# Patient Record
Sex: Female | Born: 1980 | Race: White | Hispanic: No | Marital: Married | Smoking: Current every day smoker
Health system: Southern US, Community
[De-identification: ages and names within clinical notes are randomized; demographics above are authoritative.]

## PROBLEM LIST (undated history)

## (undated) DIAGNOSIS — K219 Gastro-esophageal reflux disease without esophagitis: Secondary | ICD-10-CM

## (undated) DIAGNOSIS — F419 Anxiety disorder, unspecified: Secondary | ICD-10-CM

## (undated) DIAGNOSIS — M419 Scoliosis, unspecified: Secondary | ICD-10-CM

## (undated) DIAGNOSIS — Z8619 Personal history of other infectious and parasitic diseases: Secondary | ICD-10-CM

## (undated) DIAGNOSIS — Z90710 Acquired absence of both cervix and uterus: Secondary | ICD-10-CM

## (undated) HISTORY — DX: Scoliosis, unspecified: M41.9

## (undated) HISTORY — PX: UPPER GI ENDOSCOPY: SHX6162

## (undated) HISTORY — PX: COLONOSCOPY: SHX174

---

## 2004-04-23 ENCOUNTER — Inpatient Hospital Stay: Payer: Self-pay | Admitting: Unknown Physician Specialty

## 2004-04-28 ENCOUNTER — Inpatient Hospital Stay: Payer: Self-pay

## 2007-06-20 ENCOUNTER — Emergency Department: Payer: Self-pay | Admitting: Emergency Medicine
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
## 2009-04-18 HISTORY — PX: TUBAL LIGATION: SHX77

## 2009-12-31 ENCOUNTER — Observation Stay: Payer: Self-pay | Admitting: Obstetrics and Gynecology

## 2010-01-04 ENCOUNTER — Observation Stay: Payer: Self-pay | Admitting: Obstetrics and Gynecology

## 2010-01-22 ENCOUNTER — Inpatient Hospital Stay: Payer: Self-pay | Admitting: Obstetrics and Gynecology

## 2010-01-27 LAB — PATHOLOGY REPORT

## 2014-04-25 ENCOUNTER — Emergency Department: Payer: Self-pay | Admitting: Emergency Medicine

## 2015-02-23 ENCOUNTER — Other Ambulatory Visit: Payer: Self-pay | Admitting: Family Medicine

## 2015-03-25 ENCOUNTER — Other Ambulatory Visit: Payer: Self-pay | Admitting: Family Medicine

## 2015-05-29 ENCOUNTER — Ambulatory Visit (INDEPENDENT_AMBULATORY_CARE_PROVIDER_SITE_OTHER): Payer: Self-pay | Admitting: Family Medicine

## 2015-05-29 VITALS — BP 118/68 | HR 92 | Temp 98.1°F | Resp 20 | Ht 66.0 in | Wt 156.1 lb

## 2015-05-29 DIAGNOSIS — N3001 Acute cystitis with hematuria: Secondary | ICD-10-CM

## 2015-05-29 LAB — POCT URINALYSIS DIPSTICK
Bilirubin, UA: NEGATIVE
GLUCOSE UA: NEGATIVE
Ketones, UA: NEGATIVE
NITRITE UA: NEGATIVE
Protein, UA: NEGATIVE
SPEC GRAV UA: 1.02
Urobilinogen, UA: NEGATIVE
pH, UA: 6.5

## 2015-05-29 MED ORDER — CEPHALEXIN 500 MG PO CAPS: 500.0000 mg | ORAL_CAPSULE | Freq: Two times a day (BID) | ORAL | Status: DC

## 2015-05-29 NOTE — Progress Notes (Signed)
Name: Susan Wade   MRN: [id number]    DOB: 23-Dec-1980   Date:05/29/2015       Progress Note  Subjective  Chief Complaint  Chief Complaint  Patient presents with  . Urinary Tract Infection    Urinary Tract Infection  This is a new problem. The pain is at a severity of 7/10. There has been no fever. Associated symptoms include hematuria. Her past medical history is significant for recurrent UTIs.     No past medical history on file.  No past surgical history on file.  No family history on file.  Social History   Social History  . Marital Status: Married    Spouse Name: N/A  . Number of Children: N/A  . Years of Education: N/A   Occupational History  . Not on file.   Social History Main Topics  . Smoking status: Not on file  . Smokeless tobacco: Not on file  . Alcohol Use: Not on file  . Drug Use: Not on file  . Sexual Activity: Not on file   Other Topics Concern  . Not on file   Social History Narrative  . No narrative on file     Current outpatient prescriptions:  Marland Kitchen  MONO-LINYAH 0.25-35 MG-MCG tablet, take 1 tablet by mouth once daily, Disp: 28 tablet, Rfl: 5  Allergies not on file   Review of Systems  Genitourinary: Positive for hematuria.      Objective  Filed Vitals:   05/29/15 1157  BP: 118/68  Pulse: 92  Temp: 98.1 F (36.7 C)  Resp: 20  Height:  (1.676 m)  Weight: 156 lb 1 oz (70.789 kg)  SpO2: 94%    Physical Exam  Constitutional: She is oriented to person, place, and time and well-developed, well-nourished, and in no distress.  Neurological: She is alert and oriented to person, place, and time.  Nursing note and vitals reviewed.      Recent Results (from the past 2160 hour(s))  POCT Urinalysis Dipstick     Status: Abnormal   Collection Time: 05/29/15 11:59 AM  Result Value Ref Range   Color, UA dark yellow    Clarity, UA cloudy    Glucose, UA neg    Bilirubin, UA neg    Ketones, UA neg    Spec Grav, UA 1.020     Blood, UA large    pH, UA 6.5    Protein, UA neg    Urobilinogen, UA negative    Nitrite, UA neg    Leukocytes, UA large (3+) (A) Negative     Assessment & Plan  1. Acute cystitis with hematuria Has been treated with Keflex in the past for UTIs. We'll obtain urinalysis and culture and start on antibiotic therapy. - POCT Urinalysis Dipstick - Urinalysis, Routine w reflex microscopic - Urine Culture - cephALEXin (KEFLEX) 500 MG capsule; Take 1 capsule (500 mg total) by mouth 2 (two) times daily.  Dispense: 20 capsule; Refill: 0   Trei Schoch [facility] 05/29/2015 12:13 PM

## 2015-05-30 LAB — SPECIMEN STATUS REPORT

## 2015-06-02 ENCOUNTER — Other Ambulatory Visit: Payer: Self-pay | Admitting: Family Medicine

## 2015-06-02 LAB — URINE CULTURE

## 2015-06-02 LAB — SPECIMEN STATUS REPORT

## 2015-06-05 ENCOUNTER — Other Ambulatory Visit: Payer: Self-pay | Admitting: Family Medicine

## 2015-06-05 DIAGNOSIS — N3000 Acute cystitis without hematuria: Secondary | ICD-10-CM

## 2015-06-05 LAB — URINALYSIS, ROUTINE W REFLEX MICROSCOPIC

## 2015-06-05 MED ORDER — NITROFURANTOIN MACROCRYSTAL 100 MG PO CAPS: 100.0000 mg | ORAL_CAPSULE | Freq: Two times a day (BID) | ORAL | Status: DC

## 2015-09-08 ENCOUNTER — Other Ambulatory Visit: Payer: Self-pay

## 2015-09-08 MED ORDER — NORGESTIMATE-ETH ESTRADIOL 0.25-35 MG-MCG PO TABS: 1.0000 | ORAL_TABLET | Freq: Every day | ORAL | Status: DC

## 2015-10-06 ENCOUNTER — Other Ambulatory Visit: Payer: Self-pay | Admitting: Family Medicine

## 2015-10-06 NOTE — Telephone Encounter (Signed)
Refill was requested in May; note attached saying patient needs an appt; last physical was Sept of 2015 Rx denied

## 2015-10-09 ENCOUNTER — Telehealth: Payer: Self-pay | Admitting: Family Medicine

## 2015-10-09 NOTE — Telephone Encounter (Signed)
I'm sorry, but she has not been seen in well over a year; she can try an urgent care clinic or the health department

## 2015-10-09 NOTE — Telephone Encounter (Signed)
PT HAS NOT BEEN SEEN SINCE 03-19-2014

## 2015-10-09 NOTE — Telephone Encounter (Signed)
[facility] BUT BEFORE THAT LAST APPT WAS ON 03-19-14 WITH SUNDARAM. PT IS CALLING SAYING THAT SHE NEEDS HER BIRTH CONTROL REFILLED BUT THE ISSUE IS THAT SHE IS LEAVING TO GO TO [place] ON Monday AND WILL BE GONE 3 WEEKS. WILL YOU FILL THIS FOR HER OR WHAT DO YOU SUGGEST THAT SHE DO. PATIENT IS MAKING AN APPT TO SEE YOU FOR WHEN SHE COMES BACK

## 2015-10-09 NOTE — Telephone Encounter (Signed)
Patient notified

## 2015-10-30 ENCOUNTER — Ambulatory Visit: Payer: Self-pay | Admitting: Family Medicine

## 2019-03-21 ENCOUNTER — Other Ambulatory Visit: Payer: Self-pay

## 2019-03-21 ENCOUNTER — Ambulatory Visit (INDEPENDENT_AMBULATORY_CARE_PROVIDER_SITE_OTHER): Payer: PRIVATE HEALTH INSURANCE | Admitting: Family Medicine

## 2019-03-21 ENCOUNTER — Encounter: Payer: Self-pay | Admitting: Family Medicine

## 2019-03-21 VITALS — BP 118/70 | HR 73 | Temp 98.6°F | Resp 12 | Ht 66.0 in | Wt 128.8 lb

## 2019-03-21 DIAGNOSIS — Z23 Encounter for immunization: Secondary | ICD-10-CM

## 2019-03-21 DIAGNOSIS — L608 Other nail disorders: Secondary | ICD-10-CM | POA: Insufficient documentation

## 2019-03-21 DIAGNOSIS — Z800 Family history of malignant neoplasm of digestive organs: Secondary | ICD-10-CM | POA: Insufficient documentation

## 2019-03-21 DIAGNOSIS — M419 Scoliosis, unspecified: Secondary | ICD-10-CM

## 2019-03-21 NOTE — Assessment & Plan Note (Signed)
Discussed trial of PT to see if targeted home exercise routine and decrease back pain.

## 2019-03-21 NOTE — Patient Instructions (Addendum)
#   Toe Nail - Podiatry referral   #GI - referral for colonoscopy  #Scoliosis - pt referral

## 2019-03-21 NOTE — Assessment & Plan Note (Signed)
Suspect onychomycosis but given dark brown color and possible trauma will refer to podiatry for second opinion prior to starting treatment

## 2019-03-21 NOTE — Progress Notes (Signed)
Subjective:     Susan Wade is a 38 y.o. female presenting for Establish Care, Colonoscopy (father died of colon cancer), and left great toenail brown (had pedicure in september)     HPI   #Family hx of colon cancer - sister just had a polyp found  - several family members - does have constipation - intermittent symptoms - no blood or dark or tarry stool - drinks water and eats healthy  #left great toenail - toenail sore - also went hiking - normally keeps toenails a little long - toe was sore after hiking  - brown discoloration since the end of October  #Scoliosis - will get chronic back pain - worse during periods  #Tobacco use - husband also smokes - interested in quitting  Review of Systems  Gastrointestinal: Positive for constipation. Negative for abdominal pain, blood in stool and diarrhea.  Musculoskeletal: Positive for back pain.     Social History   Tobacco Use  Smoking Status Current Every Day Smoker  . Packs/day: 0.50  . Years: 17.00  . Pack years: 8.50  . Types: Cigarettes  Smokeless Tobacco Never Used        Objective:    BP Readings from Last 3 Encounters:  03/21/19 118/70  05/29/15 118/68   Wt Readings from Last 3 Encounters:  03/21/19 128 lb 12.8 oz (58.4 kg)  05/29/15 156 lb 1 oz (70.8 kg)    BP 118/70 (BP Location: Right Arm, Cuff Size: Normal)   Pulse 73   Temp 98.6 F (37 C) (Temporal)   Resp 12   Ht 5\' 6"  (1.676 m)   Wt 128 lb 12.8 oz (58.4 kg)   LMP 02/20/2019   SpO2 99%   BMI 20.79 kg/m    Physical Exam Constitutional:      General: She is not in acute distress.    Appearance: She is well-developed. She is not diaphoretic.  HENT:     Right Ear: External ear normal.     Left Ear: External ear normal.     Nose: Nose normal.  Eyes:     Conjunctiva/sclera: Conjunctivae normal.  Neck:     Musculoskeletal: Neck supple.  Cardiovascular:     Rate and Rhythm: Normal rate.  Pulmonary:     Effort: Pulmonary  effort is normal.  Feet:     Left foot:     Toenail Condition: Left toenails are abnormally thick. Fungal disease present.    Comments: Toenail with brown discoloration Skin:    General: Skin is warm and dry.     Capillary Refill: Capillary refill takes less than 2 seconds.  Neurological:     Mental Status: She is alert. Mental status is at baseline.  Psychiatric:        Mood and Affect: Mood normal.        Behavior: Behavior normal.           Assessment & Plan:   Problem List Items Addressed This Visit      Musculoskeletal and Integument   Scoliosis    Discussed trial of PT to see if targeted home exercise routine and decrease back pain.       Relevant Orders   Ambulatory referral to Physical Therapy   Toenail deformity - Primary    Suspect onychomycosis but given dark brown color and possible trauma will refer to podiatry for second opinion prior to starting treatment       Relevant Orders   Ambulatory referral to Podiatry  Other   Family history of colon cancer in father    Father just died from colon cancer and several other family members w/ colon cancer at young age. Referral for screening colonoscopy      Relevant Orders   Ambulatory referral to Gastroenterology    Other Visit Diagnoses    Influenza vaccine administered       Relevant Orders   Flu Vaccine QUAD 6+ mos PF IM (Fluarix Quad PF) (Completed)       Return if symptoms worsen or fail to improve.  Lesleigh Noe, MD

## 2019-03-21 NOTE — Assessment & Plan Note (Signed)
Father just died from colon cancer and several other family members w/ colon cancer at young age. Referral for screening colonoscopy

## 2019-04-02 ENCOUNTER — Encounter: Payer: Self-pay | Admitting: Family Medicine

## 2019-04-09 ENCOUNTER — Other Ambulatory Visit: Payer: Self-pay

## 2019-04-09 ENCOUNTER — Encounter: Payer: Self-pay | Admitting: Podiatry

## 2019-04-09 ENCOUNTER — Ambulatory Visit (INDEPENDENT_AMBULATORY_CARE_PROVIDER_SITE_OTHER): Payer: PRIVATE HEALTH INSURANCE | Admitting: Podiatry

## 2019-04-09 VITALS — BP 146/84 | HR 79

## 2019-04-09 DIAGNOSIS — L603 Nail dystrophy: Secondary | ICD-10-CM | POA: Diagnosis not present

## 2019-04-15 NOTE — Progress Notes (Signed)
   Subjective: 38 y.o. female presenting today as a new patient with a chief complaint of discoloration of the bilateral great toenails that began 3-4 months ago. She reports associated lifting of the nails and reports some tenderness of the distal nails. Touching the nails increases the discomfort. She states she normally gets pedicures. She has not had any specific treatment for this complaint. Patient is here for further evaluation and treatment.   Past Medical History:  Diagnosis Date  . Scoliosis     Objective: Physical Exam General: The patient is alert and oriented x3 in no acute distress.  Dermatology: Hyperkeratotic, discolored, thickened, onychodystrophy noted to the bilateral great toenails. Skin is warm, dry and supple bilateral lower extremities. Negative for open lesions or macerations.  Vascular: Palpable pedal pulses bilaterally. No edema or erythema noted. Capillary refill within normal limits.  Neurological: Epicritic and protective threshold grossly intact bilaterally.   Musculoskeletal Exam: Range of motion within normal limits to all pedal and ankle joints bilateral. Muscle strength 5/5 in all groups bilateral.   Assessment: #1 Onychomycosis bilateral great toenails  #2 Hyperkeratotic nails bilateral great toenails   Plan of Care:  #1 Patient was evaluated. #2 Explained that a new nail is growing underneath the old nail plate. Recommended observation at this time.  #3 Return to clinic as needed if the nails do not fall off and she would like the old nail plates removed.    Edrick Kins, DPM [facility]  Dr. Edrick Kins, [place]                                        [address], [place] [address]                Office [phone]  Fax [phone]

## 2019-04-25 ENCOUNTER — Other Ambulatory Visit (HOSPITAL_COMMUNITY)
Admission: RE | Admit: 2019-04-25 | Discharge: 2019-04-25 | Disposition: A | Discharge status: Home / Self Care | Payer: Managed Care, Other (non HMO) | Source: Ambulatory Visit | Attending: Obstetrics and Gynecology | Admitting: Obstetrics and Gynecology

## 2019-04-25 ENCOUNTER — Encounter: Payer: Self-pay | Admitting: Obstetrics and Gynecology

## 2019-04-25 ENCOUNTER — Other Ambulatory Visit: Payer: Self-pay

## 2019-04-25 ENCOUNTER — Ambulatory Visit (INDEPENDENT_AMBULATORY_CARE_PROVIDER_SITE_OTHER): Payer: PRIVATE HEALTH INSURANCE | Admitting: Obstetrics and Gynecology

## 2019-04-25 VITALS — BP 130/90 | Ht 66.0 in | Wt 129.0 lb

## 2019-04-25 DIAGNOSIS — Z124 Encounter for screening for malignant neoplasm of cervix: Secondary | ICD-10-CM

## 2019-04-25 DIAGNOSIS — Z01419 Encounter for gynecological examination (general) (routine) without abnormal findings: Secondary | ICD-10-CM | POA: Diagnosis not present

## 2019-04-25 DIAGNOSIS — N939 Abnormal uterine and vaginal bleeding, unspecified: Secondary | ICD-10-CM

## 2019-04-25 NOTE — Progress Notes (Signed)
Gynecology Annual Exam   PCP: Lynnda Child, MD  Chief Complaint:  Chief Complaint  Patient presents with  . Gynecologic Exam    History of Present Illness: Patient is a 39 y.o. presents for annual exam. The patient has no complaints today.   LMP: Patient's last menstrual period was 04/25/2019. Average Interval: regular, 28 days Duration of flow: 7 days Heavy Menses: yes Clots: yes Intermenstrual Bleeding: no Postcoital Bleeding: no Dysmenorrhea: yes  The patient is sexually active. She currently uses tubal ligation for contraception. She denies dyspareunia.  The patient does perform self breast exams.  There is no notable family history of breast or ovarian cancer in her family.  The patient wears seatbelts: yes.   The patient has regular exercise: not asked.    The patient denies current symptoms of depression.    Review of Systems: Review of Systems  Constitutional: Negative for chills and fever.  HENT: Negative for congestion.   Respiratory: Negative for cough and shortness of breath.   Cardiovascular: Negative for chest pain and palpitations.  Gastrointestinal: Negative for abdominal pain, constipation, diarrhea, heartburn, nausea and vomiting.  Genitourinary: Negative for dysuria, frequency and urgency.  Skin: Negative for itching and rash.  Neurological: Negative for dizziness and headaches.  Endo/Heme/Allergies: Negative for polydipsia.  Psychiatric/Behavioral: Negative for depression.    Past Medical History:  Past Medical History:  Diagnosis Date  . Scoliosis     Past Surgical History:  Past Surgical History:  Procedure Laterality Date  . TUBAL LIGATION  2011    Gynecologic History:  Patient's last menstrual period was 04/25/2019. Contraception: tubal ligation Last Pap: Results were:06/30/2009 NIL  Obstetric History: No obstetric history on file.  Family History:  Family History  Problem Relation Age of Onset  . Hypertension Mother   .  Hyperlipidemia Mother   . Colon cancer Father 39  . Rectal cancer Father   . Hyperlipidemia Maternal Grandfather   . Hypertension Maternal Grandfather   . Diabetes Paternal Grandmother   . Heart disease Paternal Grandmother   . Hyperlipidemia Paternal Grandmother   . Hypertension Paternal Grandmother   . Colon cancer Paternal Grandfather   . Colon polyps Sister 84  . Colon cancer Cousin 35  . Colon cancer Cousin 42    Social History:  Social History   Socioeconomic History  . Marital status: Married    Spouse name: Apolinar Junes  . Number of children: 2  . Years of education: high school  . Highest education level: Not on file  Occupational History  . Not on file  Tobacco Use  . Smoking status: Current Every Day Smoker    Packs/day: 0.50    Years: 17.00    Pack years: 8.50    Types: Cigarettes  . Smokeless tobacco: Never Used  Substance and Sexual Activity  . Alcohol use: Yes    Comment: glass of wine a few nights a week  . Drug use: Never  . Sexual activity: Yes    Birth control/protection: Surgical  Other Topics Concern  . 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 (family in [place])      Exercise: going to the gym - walking the treadmill, weight  lifting   Diet: fruits, veggies, fish/chicken, some red meats      Safety   Seat belts: Yes    Guns: No   Safe in relationships: Yes    Social Determinants of Health   Financial Resource Strain: Low Risk   . Difficulty of Paying Living Expenses: Not hard at all  Food Insecurity:   . Worried About Programme researcher, broadcasting/film/video in the Last Year: Not on file  . Ran Out of Food in the Last Year: Not on file  Transportation Needs:   . Lack of Transportation (Medical): Not on file  . Lack of Transportation (Non-Medical): Not on  file  Physical Activity:   . Days of Exercise per Week: Not on file  . Minutes of Exercise per Session: Not on file  Stress:   . Feeling of Stress : Not on file  Social Connections:   . Frequency of Communication with Friends and Family: Not on file  . Frequency of Social Gatherings with Friends and Family: Not on file  . Attends Religious Services: Not on file  . Active Member of Clubs or Organizations: Not on file  . Attends Banker Meetings: Not on file  . Marital Status: Not on file  Intimate Partner Violence:   . Fear of Current or Ex-Partner: Not on file  . Emotionally Abused: Not on file  . Physically Abused: Not on file  . Sexually Abused: Not on file    Allergies:  No Known Allergies  Medications: Prior to Admission medications   Not on File    Physical Exam Vitals: Blood pressure 130/90, height 5\' 6"  (1.676 m), weight 129 lb (58.5 kg), last menstrual period 04/25/2019.   General: NAD, well nourished, appears stated age HEENT: normocephalic, anicteric Thyroid: no enlargement, no palpable nodules Pulmonary: No increased work of breathing, CTAB Cardiovascular: RRR, distal pulses 2+ Breast: Breast symmetrical, no tenderness, no palpable nodules or masses, no skin or nipple retraction present, no nipple discharge.  No axillary or supraclavicular lymphadenopathy. Abdomen: NABS, soft, non-tender, non-distended.  Umbilicus without lesions.  No hepatomegaly, splenomegaly or masses palpable. No evidence of hernia  Genitourinary:  External: Normal external female genitalia.  Normal urethral meatus, normal Bartholin's and Skene's glands.    Vagina: Normal vaginal mucosa, no evidence of prolapse.    Cervix: Grossly normal in appearance, no bleeding  Uterus: Non-enlarged, mobile, normal contour.  No CMT  Adnexa: ovaries non-enlarged, no adnexal masses  Rectal: deferred  Lymphatic: no evidence of inguinal lymphadenopathy Extremities: no edema, erythema, or  tenderness Neurologic: Grossly intact Psychiatric: mood appropriate, affect full  Female chaperone present for pelvic and breast  portions of the physical exam    Assessment: 39 y.o. No obstetric history on file. routine annual exam  Plan: Problem List Items Addressed This Visit    None    Visit Diagnoses    Encounter for gynecological examination without abnormal finding    -  Primary   Screening for malignant neoplasm of cervix       Relevant Orders   Cytology - PAP   Abnormal uterine bleeding       Relevant Orders   30 Transvaginal Non-OB   PCOS panel (TSH+PRL+FSH+TESTT+LH+DHEA S...)   CBC      1) STI screening  was notoffered and therefore not obtained  2)  ASCCP guidelines and rational discussed.  Patient opts for every 3 years screening interval  3) Contraception - the patient is currently using  tubal ligation.  She is happy with her current form of contraception and plans to continue  4) Routine healthcare maintenance including cholesterol, diabetes screening discussed managed by PCP  5) AUB - Discussed management options for abnormal uterine bleeding including expectant, NSAIDs, tranexamic acid (Lysteda), oral progesterone (Provera, norethindrone, megace), Depo Provera, Levonorgestrel containing IUD, endometrial ablation (Novasure) or hysterectomy as definitive surgical management.  Discussed risks and benefits of each method.   Final management decision will hinge on results of patient's work up and whether an underlying etiology for the patients bleeding symptoms can be discerned.  We will conduct a basic work up examining using the PALM-COIEN classification system.   - patient is ultimately most interested in hysterectomy  6) Return in about 1 week (around 05/02/2019) for TVUS and follow up.   Malachy Mood, MD, [facility], [facility] 04/25/2019, 5:41 PM

## 2019-04-30 LAB — CYTOLOGY - PAP
Comment: NEGATIVE
Diagnosis: NEGATIVE
High risk HPV: NEGATIVE

## 2019-05-10 ENCOUNTER — Ambulatory Visit (INDEPENDENT_AMBULATORY_CARE_PROVIDER_SITE_OTHER): Payer: PRIVATE HEALTH INSURANCE

## 2019-05-10 ENCOUNTER — Other Ambulatory Visit: Payer: Self-pay

## 2019-05-10 ENCOUNTER — Ambulatory Visit (INDEPENDENT_AMBULATORY_CARE_PROVIDER_SITE_OTHER): Payer: PRIVATE HEALTH INSURANCE | Admitting: Obstetrics and Gynecology

## 2019-05-10 ENCOUNTER — Encounter: Payer: Self-pay | Admitting: Obstetrics and Gynecology

## 2019-05-10 VITALS — BP 104/62 | Wt 126.0 lb

## 2019-05-10 DIAGNOSIS — N939 Abnormal uterine and vaginal bleeding, unspecified: Secondary | ICD-10-CM | POA: Diagnosis not present

## 2019-05-10 NOTE — Progress Notes (Signed)
Gynecology Ultrasound Follow Up  Chief Complaint:  Chief Complaint  Patient presents with  . Wound Check    GYN Ultrasound     History of Present Illness: Patient is a 39 y.o. female who presents today for ultrasound evaluation of menorrhagia.  Ultrasound demonstrates the following findgins Adnexa: no masses seen  Uterus: Non-enlarged, no evidence of fibroids. Endometrial stripe homogenous, no focal defects Additional: no free fluid  Review of Systems: Review of Systems  Constitutional: Negative.   Gastrointestinal: Negative.   Genitourinary: Negative.     Past Medical History:  Past Medical History:  Diagnosis Date  . Scoliosis     Past Surgical History:  Past Surgical History:  Procedure Laterality Date  . TUBAL LIGATION  2011    Gynecologic History:  Patient's last menstrual period was 04/25/2019. Contraception: tubal ligation Last Pap: 04/25/2019 Results were: .no abnormalities  Family History:  Family History  Problem Relation Age of Onset  . Hypertension Mother   . Hyperlipidemia Mother   . Colon cancer Father 68  . Rectal cancer Father   . Hyperlipidemia Maternal Grandfather   . Hypertension Maternal Grandfather   . Diabetes Paternal Grandmother   . Heart disease Paternal Grandmother   . Hyperlipidemia Paternal Grandmother   . Hypertension Paternal Grandmother   . Colon cancer Paternal Grandfather   . Colon polyps Sister 64  . Colon cancer Cousin 83  . Colon cancer Cousin 30    Social History:  Social History   Socioeconomic History  . Marital status: Married    Spouse name: Erlene Quan  . Number of children: 2  . Years of education: high school  . Highest education level: Not on file  Occupational History  . Not on file  Tobacco Use  . Smoking status: Current Every Day Smoker    Packs/day: 0.50    Years: 17.00    Pack years: 8.50    Types: Cigarettes  . Smokeless tobacco: Never Used  Substance and Sexual Activity  . Alcohol use: Yes     Comment: glass of wine a few nights a week  . Drug use: Never  . Sexual activity: Yes    Birth control/protection: Surgical  Other Topics Concern  . 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 Difficulty of Paying Living Expenses: Not hard at all  Food Insecurity:   . Worried About Charity fundraiser in the Last Year: Not on file  . Ran Out of Food in the Last Year: Not on file  Transportation Needs:   . Lack of Transportation (Medical): Not on file  . Lack of Transportation (Non-Medical): Not on file  Physical Activity:   . Days of Exercise per Week: Not on file  . Minutes of Exercise per Session: Not on file  Stress:   . Feeling  of Stress : Not on file  Social Connections:   . Frequency of Communication with Friends and Family: Not on file  . Frequency of Social Gatherings with Friends and Family: Not on file  . Attends Religious Services: Not on file  . Active Member of Clubs or Organizations: Not on file  . Attends Banker Meetings: Not on file  . Marital Status: Not on file  Intimate Partner Violence:   . Fear of Current or Ex-Partner: Not on file  . Emotionally Abused: Not on file  . Physically Abused: Not on file  . Sexually Abused: Not on file    Allergies:  No Known Allergies  Medications: Prior to Admission medications   Not on File    Physical Exam Vitals: Blood pressure 104/62, weight 126 lb (57.2 kg), last menstrual period  04/25/2019.  General: NAD HEENT: normocephalic, anicteric Pulmonary: No increased work of breathing Extremities: no edema, erythema, or tenderness Neurologic: Grossly intact, normal gait Psychiatric: mood appropriate, affect full  US Transvaginal Non-OB  Result Date: 05/10/2019 Patient Name: Susan Wade DOB: 02-Jan-1981 MRN: [id number] ULTRASOUND REPORT Location: [facility] Date of Service: 05/10/2019 Indications:Abnormal Uterine Bleeding Findings: The uterus is anteverted and measures 8.6 x 5.6 x 4.2 cm. Echo texture is homogenous without evidence of focal masses. The Endometrium measures 11.4 mm. Right Ovary measures 3.5 x 2.1 x 1.9 cm. It is normal in appearance. Left Ovary measures 4.1 x 2.7 x 2.8 cm. It is normal in appearance. Survey of the adnexa demonstrates no adnexal masses. There is no free fluid in the cul de sac. Impression: 1. Normal pelvic ultrasound. Recommendations: 1.Clinical correlation with the patient's History and Physical Exam. Deanna Artis, RT Images reviewed.  Normal GYN study without visualized pathology.  Vena Austria, MD, Evern Core [facility], [facility] 05/10/2019, 11:58 AM     Assessment: 39 y.o. follow up AUB Plan: Problem List Items Addressed This Visit    None    Visit Diagnoses    Abnormal uterine bleeding    -  Primary      1) AUB - no identifiable structural abnormality.  No suspicion of hormonal etiology given regular cycle intervals.  Most likely etiology remains adenomyosis although not demonstrated on ultrasound today.  Management options previously discussed with patient still most interested in proceeding with laparoscopic hysterectomy for definitive management     2) A total of 15 minutes were spent in face-to-face contact with the patient during this encounter with over half of that time devoted to counseling and coordination of care.  3) Return if symptoms worsen or fail to improve.    Vena Austria, MD,  [facility], [facility] 05/10/2019, 12:38 PM

## 2019-05-11 LAB — TSH+PRL+FSH+TESTT+LH+DHEA S...
17-Hydroxyprogesterone: 61 ng/dL
Androstenedione: 190 ng/dL (ref 41–262)
DHEA-SO4: 231 ug/dL (ref 57.3–279.2)
FSH: 6.8 m[IU]/mL
LH: 5.4 m[IU]/mL
Prolactin: 11.5 ng/mL (ref 4.8–23.3)
TSH: 1.71 u[IU]/mL (ref 0.450–4.500)
Testosterone, Free: 2.9 pg/mL (ref 0.0–4.2)
Testosterone: 41 ng/dL (ref 8–48)

## 2019-05-11 LAB — CBC
Hematocrit: 41 % (ref 34.0–46.6)
Hemoglobin: 13.4 g/dL (ref 11.1–15.9)
MCH: 29.1 pg (ref 26.6–33.0)
MCHC: 32.7 g/dL (ref 31.5–35.7)
MCV: 89 fL (ref 79–97)
Platelets: 246 10*3/uL (ref 150–450)
RBC: 4.61 x10E6/uL (ref 3.77–5.28)
RDW: 12.9 % (ref 11.7–15.4)
WBC: 11.9 10*3/uL — ABNORMAL HIGH (ref 3.4–10.8)

## 2019-05-22 ENCOUNTER — Telehealth: Payer: Self-pay | Admitting: Obstetrics and Gynecology

## 2019-05-22 NOTE — Telephone Encounter (Signed)
Patient is aware of H&P at [facility] [place] on 2/5 @ 2:50pm w/ Dr. Bonney Aid, Pre-admit Testing to be scheduled, COVID testing on 2/9, and OR on 05/30/19. Patient is aware to quarantine after COVID testing. Patient is aware she may receive calls from the [facility] and [facility]. Patient confirmed Rosann Auerbach, and no secondary insurance.

## 2019-05-24 ENCOUNTER — Other Ambulatory Visit: Payer: Self-pay

## 2019-05-24 ENCOUNTER — Encounter: Payer: Self-pay | Admitting: Obstetrics and Gynecology

## 2019-05-24 ENCOUNTER — Ambulatory Visit (INDEPENDENT_AMBULATORY_CARE_PROVIDER_SITE_OTHER): Payer: PRIVATE HEALTH INSURANCE | Admitting: Obstetrics and Gynecology

## 2019-05-24 VITALS — BP 114/76 | HR 74 | Ht 66.0 in | Wt 127.0 lb

## 2019-05-24 DIAGNOSIS — Z01818 Encounter for other preprocedural examination: Secondary | ICD-10-CM

## 2019-05-24 DIAGNOSIS — N938 Other specified abnormal uterine and vaginal bleeding: Secondary | ICD-10-CM

## 2019-05-24 NOTE — Progress Notes (Signed)
Obstetrics & Gynecology Surgery H&P    Chief Complaint: Scheduled Surgery   History of Present Illness: Patient is a 39 y.o. No obstetric history on file. presenting for scheduled TLH, BS, cysto, for the treatment or further evaluation of AUB.   Prior Treatments prior to proceeding with surgery include:alternative such as hormonal management and ablation discussed  Preoperative Pap: 04/25/2019 NIL HPV negative Preoperative Ultrasound: 05/10/2019 normal   Review of Systems:10 point review of systems  Past Medical History:  Past Medical History:  Diagnosis Date   Scoliosis     Past Surgical History:  Past Surgical History:  Procedure Laterality Date   TUBAL LIGATION  2011    Family History:  Family History  Problem Relation Age of Onset   Hypertension Mother    Hyperlipidemia Mother    Colon cancer Father 59   Rectal cancer Father    Hyperlipidemia Maternal Grandfather    Hypertension Maternal Grandfather    Diabetes Paternal Grandmother    Heart disease Paternal Grandmother    Hyperlipidemia Paternal Grandmother    Hypertension Paternal Grandmother    Colon cancer Paternal Grandfather    Colon polyps Sister 61   Colon cancer Cousin 33   Colon cancer Cousin 20    Social History:  Social History   Socioeconomic History   Marital status: Married    Spouse name: Erlene Quan   Number of children: 2   Years of education: high school   Highest education level: Not on file  Occupational History   Not on file  Tobacco Use   Smoking status: Current Every Day Smoker    Packs/day: 0.50    Years: 17.00    Pack years: 8.50    Types: Cigarettes   Smokeless tobacco: Never Used  Substance and Sexual Activity   Alcohol use: Yes    Comment: glass of wine a few nights a week   Drug use: Never   Sexual activity: Yes    Birth control/protection: Surgical  Other Topics Concern   Not on file  Social History Narrative   03/21/19   From: the  area   Living: with husband and children   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      Family: Quintin Alto and Optician, dispensing (2011 and 2006)      Enjoys: hiking, go to [employer] (family in [place])      Exercise: going to the gym - walking the treadmill, weight lifting   Diet: fruits, veggies, fish/chicken, some red meats      Safety   Seat belts: Yes    Guns: No   Safe in relationships: Yes    Social Determinants of Radio broadcast assistant Strain: Low Risk    Difficulty of Paying Living Expenses: Not hard at all  Food Insecurity:    Worried About Charity fundraiser in the Last Year: Not on file   [employer] of Food in the Last Year: Not on file  Transportation Needs:    Lack of Transportation (Medical): Not on file   Lack of Transportation (Non-Medical): Not on file  Physical Activity:    Days of Exercise per Week: Not on file   Minutes of Exercise per Session: Not on file  Stress:    Feeling of Stress : Not on file  Social Connections:    Frequency of Communication with Friends and Family: Not on file   Frequency of Social Gatherings with Friends and Family: Not on file  Attends Religious Services: Not on file   Active Member of Clubs or Organizations: Not on file   Attends Banker Meetings: Not on file   Marital Status: Not on file  Intimate Partner Violence:    Fear of Current or Ex-Partner: Not on file   Emotionally Abused: Not on file   Physically Abused: Not on file   Sexually Abused: Not on file    Allergies:  No Known Allergies  Medications: Prior to Admission medications   Medication Sig Start Date End Date Taking? Authorizing Provider  acetaminophen (TYLENOL) 650 MG CR tablet Take 650 mg by mouth every 8 (eight) hours as needed for pain.   Yes [provider]    Physical Exam Vitals: Blood pressure 114/76, pulse 74, height 5\' 6"  (1.676 m), weight 127 lb (57.6 kg), last menstrual period  04/25/2019. General: NAD HEENT: normocephalic, anicteric Pulmonary: No increased work of breathing, CTAB Cardiovascular: RRR, distal pulses 2+ Abdomen: soft, non-tender, non-distended Extremities: no edema, erythema, or tenderness Neurologic: Grossly intact Psychiatric: mood appropriate, affect full  Imaging 06/23/2019 Transvaginal Non-OB  Result Date: 05/10/2019 Patient Name: Susan Wade DOB: May 29, 1980 MRN: [id number] ULTRASOUND REPORT Location: [facility] Date of Service: 05/10/2019 Indications:Abnormal Uterine Bleeding Findings: The uterus is anteverted and measures 8.6 x 5.6 x 4.2 cm. Echo texture is homogenous without evidence of focal masses. The Endometrium measures 11.4 mm. Right Ovary measures 3.5 x 2.1 x 1.9 cm. It is normal in appearance. Left Ovary measures 4.1 x 2.7 x 2.8 cm. It is normal in appearance. Survey of the adnexa demonstrates no adnexal masses. There is no free fluid in the cul de sac. Impression: 1. Normal pelvic ultrasound. Recommendations: 1.Clinical correlation with the patient's History and Physical Exam. 05/12/2019, RT Images reviewed.  Normal GYN study without visualized pathology.  Deanna Artis, MD, Vena Austria [facility], [facility] 05/10/2019, 11:58 AM    Assessment: 39 y.o. No obstetric history on file. presenting for scheduled TLH, BS, cystoscopy  Plan: 1) Patient opts for definitive surgical management via hysterectomy. The risks of surgery were discussed in detail with the patient including but not limited to: bleeding which may require transfusion or reoperation; infection which may require antibiotics; injury to bowel, bladder, ureters or other surrounding organs (With a literature reported rate of urinary tract injury of 1% quoted); need for additional procedures including laparotomy; thromboembolic phenomenon, incisional problems and other postoperative/anesthesia complications.  Patient was also advised that recovery procedure  generally involves an overnight stay; and the  expected recovery time after a hysterectomy being in the range of 6-8 weeks.  Likelihood of success in alleviating the patient's symptoms was discussed.  While definitive in regards to issues with menstural bleeding, pelvic pain if present preoperatively may continue and in fact worsen postoperatively.  She is aware that the procedure will render her unable to pursue childbearing in the future.   She was told that she will be contacted by our surgical scheduler regarding the time and date of her surgery; routine preoperative instructions of having nothing to eat or drink after midnight on the day prior to surgery and also coming to the hospital 1.5 hours prior to her time of surgery were also emphasized.  She was told she may be called for a preoperative appointment about a week prior to surgery and will be given further preoperative instructions at that visit.  Routine postoperative instructions will be reviewed with the patient and her family in detail after surgery.  Printed patient education handouts about the procedure was given to the patient to review at home.   2) Routine postoperative instructions were reviewed with the patient and her family in detail today including the expected length of recovery and likely postoperative course.  The patient concurred with the proposed plan, giving informed written consent for the surgery today.  Patient instructed on the importance of being NPO after midnight prior to her procedure.  If warranted preoperative prophylactic antibiotics and SCDs ordered on call to the OR to meet SCIP guidelines and adhere to recommendation laid forth in ACOG Practice Bulletin Number 104 May 2009  "Antibiotic Prophylaxis for Gynecologic Procedures".     Vena Austria, MD, Evern Core [facility], [facility] 05/24/2019, 3:09 PM

## 2019-05-27 ENCOUNTER — Encounter
Admission: RE | Admit: 2019-05-27 | Discharge: 2019-05-27 | Disposition: A | Discharge status: Home / Self Care | Payer: PRIVATE HEALTH INSURANCE | Source: Ambulatory Visit | Attending: Obstetrics and Gynecology | Admitting: Obstetrics and Gynecology

## 2019-05-27 ENCOUNTER — Other Ambulatory Visit: Payer: Self-pay

## 2019-05-27 NOTE — Patient Instructions (Signed)
Your procedure is scheduled on: 05/30/19 Report to DAY SURGERY DEPARTMENT LOCATED ON 2ND FLOOR MEDICAL MALL ENTRANCE. To find out your arrival time please call [phone] between 1PM - 3PM on 05/29/19.  Remember: Instructions that are not followed completely may result in serious medical risk, up to and including death, or upon the discretion of your surgeon and anesthesiologist your surgery may need to be rescheduled.     _X__ 1. Do not eat food after midnight the night before your procedure.                 No gum chewing or hard candies. You may drink clear liquids up to 2 hours                 before you are scheduled to arrive for your surgery- DO not drink clear                 liquids within 2 hours of the start of your surgery.                 Clear Liquids include:  water, apple juice without pulp, clear carbohydrate                 drink such as Clearfast or Gatorade, Black Coffee or Tea (Do not add                 anything to coffee or tea). Diabetics water only  __X__2.  On the morning of surgery brush your teeth with toothpaste and water, you                 may rinse your mouth with mouthwash if you wish.  Do not swallow any              toothpaste of mouthwash.     _X__ 3.  No Alcohol for 24 hours before or after surgery.   _X__ 4.  Do Not Smoke or use e-cigarettes For 24 Hours Prior to Your Surgery.                 Do not use any chewable tobacco products for at least 6 hours prior to                 surgery.  ____  5.  Bring all medications with you on the day of surgery if instructed.   __X__  6.  Notify your doctor if there is any change in your medical condition      (cold, fever, infections).     Do not wear jewelry, make-up, hairpins, clips or nail polish. Do not wear lotions, powders, or perfumes.  Do not shave 48 hours prior to surgery. Men may shave face and neck. Do not bring valuables to the hospital.    [facility] is not responsible for any belongings or  valuables.  Contacts, dentures/partials or body piercings may not be worn into surgery. Bring a case for your contacts, glasses or hearing aids, a denture cup will be supplied. Leave your suitcase in the car. After surgery it may be brought to your room. For patients admitted to the hospital, discharge time is determined by your treatment team.   Patients discharged the day of surgery will not be allowed to drive home.   Please read over the following fact sheets that you were given:   MRSA Information  __X__ Take these medicines the morning of surgery with A SIP OF WATER:  1. none  2.   3.   4.  5.  6.  ____ Fleet Enema (as directed)   __X__ Use CHG Soap/SAGE wipes as directed  ____ Use inhalers on the day of surgery  ____ Stop metformin/Janumet/Farxiga 2 days prior to surgery    ____ Take 1/2 of usual insulin dose the night before surgery. No insulin the morning          of surgery.   ____ Stop Blood Thinners Coumadin/Plavix/Xarelto/Pleta/Pradaxa/Eliquis/Effient/Aspirin  on   Or contact your Surgeon, Cardiologist or Medical Doctor regarding  ability to stop your blood thinners  __X__ Stop Anti-inflammatories 7 days before surgery such as Advil, Ibuprofen, Motrin,  BC or Goodies Powder, Naprosyn, Naproxen, Aleve, Aspirin    __X__ Stop all herbal supplements, fish oil or vitamin E until after surgery.    ____ Bring C-Pap to the hospital.

## 2019-05-28 ENCOUNTER — Other Ambulatory Visit
Admission: RE | Admit: 2019-05-28 | Discharge: 2019-05-28 | Disposition: A | Discharge status: Home / Self Care | Payer: 59 | Source: Ambulatory Visit | Attending: Obstetrics and Gynecology | Admitting: Obstetrics and Gynecology

## 2019-05-28 DIAGNOSIS — Z20822 Contact with and (suspected) exposure to covid-19: Secondary | ICD-10-CM | POA: Insufficient documentation

## 2019-05-28 DIAGNOSIS — Z01812 Encounter for preprocedural laboratory examination: Secondary | ICD-10-CM | POA: Insufficient documentation

## 2019-05-28 LAB — CBC
HCT: 40.6 % (ref 36.0–46.0)
Hemoglobin: 13.1 g/dL (ref 12.0–15.0)
MCH: 28.6 pg (ref 26.0–34.0)
MCHC: 32.3 g/dL (ref 30.0–36.0)
MCV: 88.6 fL (ref 80.0–100.0)
Platelets: 221 10*3/uL (ref 150–400)
RBC: 4.58 MIL/uL (ref 3.87–5.11)
RDW: 13.7 % (ref 11.5–15.5)
WBC: 7.5 10*3/uL (ref 4.0–10.5)
nRBC: 0 % (ref 0.0–0.2)

## 2019-05-28 LAB — BASIC METABOLIC PANEL
Anion gap: 10 (ref 5–15)
BUN: 10 mg/dL (ref 6–20)
CO2: 25 mmol/L (ref 22–32)
Calcium: 9.3 mg/dL (ref 8.9–10.3)
Chloride: 108 mmol/L (ref 98–111)
Creatinine, Ser: 0.6 mg/dL (ref 0.44–1.00)
GFR calc Af Amer: >60 mL/min (ref >60)
GFR calc non Af Amer: >60 mL/min (ref >60)
Glucose, Bld: 82 mg/dL (ref 70–99)
Potassium: 3.7 mmol/L (ref 3.5–5.1)
Sodium: 143 mmol/L (ref 135–145)

## 2019-05-28 LAB — TYPE AND SCREEN
ABO/RH(D): O POS
Antibody Screen: NEGATIVE

## 2019-05-28 LAB — SARS CORONAVIRUS 2 (TAT 6-24 HRS): SARS Coronavirus 2: NEGATIVE

## 2019-05-30 ENCOUNTER — Other Ambulatory Visit: Payer: Self-pay

## 2019-05-30 ENCOUNTER — Observation Stay: Payer: 59

## 2019-05-30 ENCOUNTER — Encounter: Payer: Self-pay | Admitting: Obstetrics and Gynecology

## 2019-05-30 ENCOUNTER — Observation Stay
Admission: RE | Admit: 2019-05-30 | Discharge: 2019-05-31 | Disposition: A | Discharge status: Home / Self Care | Payer: 59 | Source: Ambulatory Visit | Attending: Obstetrics and Gynecology | Admitting: Obstetrics and Gynecology

## 2019-05-30 ENCOUNTER — Encounter
Admission: RE | Disposition: A | Discharge status: Home / Self Care | Payer: Self-pay | Source: Ambulatory Visit | Attending: Obstetrics and Gynecology

## 2019-05-30 DIAGNOSIS — N946 Dysmenorrhea, unspecified: Secondary | ICD-10-CM | POA: Diagnosis not present

## 2019-05-30 DIAGNOSIS — M419 Scoliosis, unspecified: Secondary | ICD-10-CM | POA: Insufficient documentation

## 2019-05-30 DIAGNOSIS — N838 Other noninflammatory disorders of ovary, fallopian tube and broad ligament: Secondary | ICD-10-CM | POA: Diagnosis not present

## 2019-05-30 DIAGNOSIS — F17210 Nicotine dependence, cigarettes, uncomplicated: Secondary | ICD-10-CM | POA: Diagnosis not present

## 2019-05-30 DIAGNOSIS — Z800 Family history of malignant neoplasm of digestive organs: Secondary | ICD-10-CM | POA: Diagnosis not present

## 2019-05-30 DIAGNOSIS — Z791 Long term (current) use of non-steroidal anti-inflammatories (NSAID): Secondary | ICD-10-CM | POA: Insufficient documentation

## 2019-05-30 DIAGNOSIS — N72 Inflammatory disease of cervix uteri: Secondary | ICD-10-CM | POA: Insufficient documentation

## 2019-05-30 DIAGNOSIS — N888 Other specified noninflammatory disorders of cervix uteri: Secondary | ICD-10-CM | POA: Diagnosis not present

## 2019-05-30 DIAGNOSIS — N938 Other specified abnormal uterine and vaginal bleeding: Secondary | ICD-10-CM | POA: Diagnosis not present

## 2019-05-30 DIAGNOSIS — Z833 Family history of diabetes mellitus: Secondary | ICD-10-CM | POA: Diagnosis not present

## 2019-05-30 DIAGNOSIS — Z8249 Family history of ischemic heart disease and other diseases of the circulatory system: Secondary | ICD-10-CM | POA: Diagnosis not present

## 2019-05-30 DIAGNOSIS — N939 Abnormal uterine and vaginal bleeding, unspecified: Secondary | ICD-10-CM | POA: Diagnosis present

## 2019-05-30 DIAGNOSIS — N800 Endometriosis of uterus: Secondary | ICD-10-CM | POA: Diagnosis not present

## 2019-05-30 DIAGNOSIS — Z8371 Family history of colonic polyps: Secondary | ICD-10-CM | POA: Diagnosis not present

## 2019-05-30 DIAGNOSIS — Z90710 Acquired absence of both cervix and uterus: Secondary | ICD-10-CM

## 2019-05-30 HISTORY — PX: TOTAL LAPAROSCOPIC HYSTERECTOMY WITH SALPINGECTOMY: SHX6742

## 2019-05-30 HISTORY — PX: CYSTOSCOPY: SHX5120

## 2019-05-30 LAB — ABO/RH: ABO/RH(D): O POS

## 2019-05-30 LAB — POCT PREGNANCY, URINE
Preg Test, Ur: NEGATIVE
Preg Test, Ur: NEGATIVE

## 2019-05-30 SURGERY — HYSTERECTOMY, TOTAL, LAPAROSCOPIC, WITH SALPINGECTOMY
Anesthesia: General | Site: Abdomen

## 2019-05-30 MED ORDER — ALUM & MAG HYDROXIDE-SIMETH 200-200-20 MG/5ML PO SUSP
30.0000 mL | ORAL | Status: DC | PRN
  Administered 2019-05-30: 20:00:00 30 mL via ORAL
  Filled 2019-05-30: qty 30

## 2019-05-30 MED ORDER — OXYCODONE HCL 5 MG PO TABS
5.0000 mg | ORAL_TABLET | Freq: Once | ORAL | Status: AC
  Administered 2019-05-30: 17:00:00 5 mg via ORAL

## 2019-05-30 MED ORDER — KETAMINE HCL 50 MG/ML IJ SOLN
INTRAMUSCULAR | Status: DC | PRN
  Administered 2019-05-30 (×2): 25 mg via INTRAVENOUS

## 2019-05-30 MED ORDER — BELLADONNA ALKALOIDS-OPIUM 16.2-60 MG RE SUPP
1.0000 | Freq: Three times a day (TID) | RECTAL | Status: DC | PRN
  Administered 2019-05-30: 1 via RECTAL
  Filled 2019-05-30 (×2): qty 1

## 2019-05-30 MED ORDER — KETAMINE HCL 50 MG/ML IJ SOLN
INTRAMUSCULAR | Status: AC
  Filled 2019-05-30: qty 10

## 2019-05-30 MED ORDER — GLYCOPYRROLATE 0.2 MG/ML IJ SOLN
INTRAMUSCULAR | Status: DC | PRN
  Administered 2019-05-30: .2 mg via INTRAVENOUS

## 2019-05-30 MED ORDER — FENTANYL CITRATE (PF) 100 MCG/2ML IJ SOLN
INTRAMUSCULAR | Status: DC | PRN
  Administered 2019-05-30 (×2): 50 ug via INTRAVENOUS

## 2019-05-30 MED ORDER — FENTANYL CITRATE (PF) 100 MCG/2ML IJ SOLN
25.0000 ug | INTRAMUSCULAR | Status: DC | PRN
  Administered 2019-05-30 (×4): 25 ug via INTRAVENOUS

## 2019-05-30 MED ORDER — IBUPROFEN 600 MG PO TABS
600.0000 mg | ORAL_TABLET | Freq: Four times a day (QID) | ORAL | Status: DC | PRN
  Administered 2019-05-30 – 2019-05-31 (×3): 600 mg via ORAL
  Filled 2019-05-30 (×3): qty 1

## 2019-05-30 MED ORDER — MIDAZOLAM HCL 2 MG/2ML IJ SOLN
INTRAMUSCULAR | Status: AC
  Filled 2019-05-30: qty 2

## 2019-05-30 MED ORDER — FAMOTIDINE 20 MG PO TABS
ORAL_TABLET | ORAL | Status: AC
  Administered 2019-05-30: 20 mg via ORAL
  Filled 2019-05-30: qty 1

## 2019-05-30 MED ORDER — ONDANSETRON HCL 4 MG/2ML IJ SOLN: 4.0000 mg | Freq: Four times a day (QID) | INTRAMUSCULAR | Status: DC | PRN

## 2019-05-30 MED ORDER — FENTANYL CITRATE (PF) 100 MCG/2ML IJ SOLN
INTRAMUSCULAR | Status: AC
  Administered 2019-05-30: 25 ug via INTRAVENOUS
  Filled 2019-05-30: qty 2

## 2019-05-30 MED ORDER — PROMETHAZINE HCL 25 MG/ML IJ SOLN
INTRAMUSCULAR | Status: AC
  Administered 2019-05-30: 6.25 mg via INTRAVENOUS
  Filled 2019-05-30: qty 1

## 2019-05-30 MED ORDER — MENTHOL 3 MG MT LOZG
1.0000 | LOZENGE | OROMUCOSAL | Status: DC | PRN
  Filled 2019-05-30: qty 9

## 2019-05-30 MED ORDER — ONDANSETRON HCL 4 MG/2ML IJ SOLN
INTRAMUSCULAR | Status: AC
  Filled 2019-05-30: qty 2

## 2019-05-30 MED ORDER — FENTANYL CITRATE (PF) 100 MCG/2ML IJ SOLN
INTRAMUSCULAR | Status: AC
  Administered 2019-05-30: 16:00:00 25 ug via INTRAVENOUS
  Filled 2019-05-30: qty 2

## 2019-05-30 MED ORDER — EPHEDRINE SULFATE 50 MG/ML IJ SOLN
INTRAMUSCULAR | Status: AC
  Filled 2019-05-30: qty 1

## 2019-05-30 MED ORDER — DEXAMETHASONE SODIUM PHOSPHATE 10 MG/ML IJ SOLN
INTRAMUSCULAR | Status: AC
  Filled 2019-05-30: qty 1

## 2019-05-30 MED ORDER — PROMETHAZINE HCL 25 MG/ML IJ SOLN: 6.2500 mg | INTRAMUSCULAR | Status: DC | PRN

## 2019-05-30 MED ORDER — MIDAZOLAM HCL 2 MG/2ML IJ SOLN
INTRAMUSCULAR | Status: DC | PRN
  Administered 2019-05-30: 2 mg via INTRAVENOUS

## 2019-05-30 MED ORDER — ROCURONIUM BROMIDE 100 MG/10ML IV SOLN
INTRAVENOUS | Status: DC | PRN
  Administered 2019-05-30: 10 mg via INTRAVENOUS
  Administered 2019-05-30: 50 mg via INTRAVENOUS

## 2019-05-30 MED ORDER — DEXTROSE-NACL 5-0.45 % IV SOLN: INTRAVENOUS | Status: DC

## 2019-05-30 MED ORDER — SUGAMMADEX SODIUM 200 MG/2ML IV SOLN
INTRAVENOUS | Status: DC | PRN
  Administered 2019-05-30: 200 mg via INTRAVENOUS

## 2019-05-30 MED ORDER — MORPHINE SULFATE (PF) 2 MG/ML IV SOLN
1.0000 mg | INTRAVENOUS | Status: DC | PRN
  Administered 2019-05-31: 04:00:00 1 mg via INTRAVENOUS
  Filled 2019-05-30: qty 1

## 2019-05-30 MED ORDER — BUPIVACAINE HCL 0.5 % IJ SOLN
INTRAMUSCULAR | Status: DC | PRN
  Administered 2019-05-30: 15 mL

## 2019-05-30 MED ORDER — FAMOTIDINE 20 MG PO TABS: 20.0000 mg | ORAL_TABLET | Freq: Once | ORAL | Status: AC

## 2019-05-30 MED ORDER — CEFAZOLIN SODIUM-DEXTROSE 2-4 GM/100ML-% IV SOLN
2.0000 g | INTRAVENOUS | Status: AC
  Administered 2019-05-30: 15:00:00 2 g via INTRAVENOUS

## 2019-05-30 MED ORDER — ACETAMINOPHEN 10 MG/ML IV SOLN
INTRAVENOUS | Status: DC | PRN
  Administered 2019-05-30: 1000 mg via INTRAVENOUS

## 2019-05-30 MED ORDER — DEXAMETHASONE SODIUM PHOSPHATE 10 MG/ML IJ SOLN
INTRAMUSCULAR | Status: DC | PRN
  Administered 2019-05-30: 10 mg via INTRAVENOUS

## 2019-05-30 MED ORDER — ONDANSETRON HCL 4 MG/2ML IJ SOLN
INTRAMUSCULAR | Status: DC | PRN
  Administered 2019-05-30: 4 mg via INTRAVENOUS

## 2019-05-30 MED ORDER — LACTATED RINGERS IV SOLN: INTRAVENOUS | Status: DC

## 2019-05-30 MED ORDER — KETOROLAC TROMETHAMINE 30 MG/ML IJ SOLN
INTRAMUSCULAR | Status: DC | PRN
  Administered 2019-05-30: 30 mg via INTRAVENOUS

## 2019-05-30 MED ORDER — FENTANYL CITRATE (PF) 100 MCG/2ML IJ SOLN
INTRAMUSCULAR | Status: AC
  Administered 2019-05-30: 17:00:00 25 ug via INTRAVENOUS
  Filled 2019-05-30: qty 2

## 2019-05-30 MED ORDER — OXYCODONE-ACETAMINOPHEN 5-325 MG PO TABS
1.0000 | ORAL_TABLET | ORAL | Status: DC | PRN
  Administered 2019-05-30 – 2019-05-31 (×4): 2 via ORAL
  Filled 2019-05-30 (×4): qty 2

## 2019-05-30 MED ORDER — CEFAZOLIN SODIUM-DEXTROSE 2-4 GM/100ML-% IV SOLN
INTRAVENOUS | Status: AC
  Filled 2019-05-30: qty 100

## 2019-05-30 MED ORDER — SODIUM CHLORIDE FLUSH 0.9 % IV SOLN
INTRAVENOUS | Status: AC
  Filled 2019-05-30: qty 10

## 2019-05-30 MED ORDER — SIMETHICONE 80 MG PO CHEW
80.0000 mg | CHEWABLE_TABLET | Freq: Four times a day (QID) | ORAL | Status: DC | PRN
  Administered 2019-05-31: 80 mg via ORAL
  Filled 2019-05-30: qty 1

## 2019-05-30 MED ORDER — ROCURONIUM BROMIDE 50 MG/5ML IV SOLN
INTRAVENOUS | Status: AC
  Filled 2019-05-30: qty 2

## 2019-05-30 MED ORDER — KETOROLAC TROMETHAMINE 30 MG/ML IJ SOLN
INTRAMUSCULAR | Status: AC
  Filled 2019-05-30: qty 1

## 2019-05-30 MED ORDER — ACETAMINOPHEN 10 MG/ML IV SOLN
INTRAVENOUS | Status: AC
  Filled 2019-05-30: qty 100

## 2019-05-30 MED ORDER — NICOTINE 21 MG/24HR TD PT24: 21.0000 mg | MEDICATED_PATCH | Freq: Every day | TRANSDERMAL | Status: DC

## 2019-05-30 MED ORDER — LIDOCAINE HCL (CARDIAC) PF 100 MG/5ML IV SOSY
PREFILLED_SYRINGE | INTRAVENOUS | Status: DC | PRN
  Administered 2019-05-30: 60 mg via INTRAVENOUS

## 2019-05-30 MED ORDER — ONDANSETRON HCL 4 MG PO TABS: 4.0000 mg | ORAL_TABLET | Freq: Four times a day (QID) | ORAL | Status: DC | PRN

## 2019-05-30 MED ORDER — FENTANYL CITRATE (PF) 100 MCG/2ML IJ SOLN
25.0000 ug | INTRAMUSCULAR | Status: AC | PRN
  Administered 2019-05-30: 25 ug via INTRAVENOUS

## 2019-05-30 MED ORDER — OXYCODONE HCL 5 MG PO TABS
ORAL_TABLET | ORAL | Status: AC
  Filled 2019-05-30: qty 1

## 2019-05-30 MED ORDER — PROPOFOL 10 MG/ML IV BOLUS
INTRAVENOUS | Status: DC | PRN
  Administered 2019-05-30: 150 mg via INTRAVENOUS

## 2019-05-30 MED ORDER — FENTANYL CITRATE (PF) 100 MCG/2ML IJ SOLN
INTRAMUSCULAR | Status: AC
  Filled 2019-05-30: qty 2

## 2019-05-30 SURGICAL SUPPLY — 53 items
BAG URINE DRAIN 2000ML AR STRL (UROLOGICAL SUPPLIES) ×3 IMPLANT
BLADE SURG SZ11 CARB STEEL (BLADE) ×3 IMPLANT
CANISTER SUCT 1200ML W/VALVE (MISCELLANEOUS) ×3 IMPLANT
CATH FOLEY 2WAY  5CC 16FR (CATHETERS) ×1
CATH URTH 16FR FL 2W BLN LF (CATHETERS) ×2 IMPLANT
CHLORAPREP W/TINT 26 (MISCELLANEOUS) ×3 IMPLANT
COVER WAND RF STERILE (DRAPES) ×3 IMPLANT
DEFOGGER SCOPE WARMER CLEARIFY (MISCELLANEOUS) ×3 IMPLANT
DERMABOND ADVANCED (GAUZE/BANDAGES/DRESSINGS) ×1
DERMABOND ADVANCED .7 DNX12 (GAUZE/BANDAGES/DRESSINGS) ×2 IMPLANT
DEVICE SUTURE ENDOST 10MM (ENDOMECHANICALS) ×3 IMPLANT
GAUZE 4X4 16PLY RFD (DISPOSABLE) ×3 IMPLANT
GLOVE BIO SURGEON STRL SZ7 (GLOVE) ×12 IMPLANT
GLOVE INDICATOR 7.5 STRL GRN (GLOVE) ×3 IMPLANT
GOWN STRL REUS W/ TWL LRG LVL3 (GOWN DISPOSABLE) ×4 IMPLANT
GOWN STRL REUS W/ TWL XL LVL3 (GOWN DISPOSABLE) ×2 IMPLANT
GOWN STRL REUS W/TWL LRG LVL3 (GOWN DISPOSABLE) ×2
GOWN STRL REUS W/TWL XL LVL3 (GOWN DISPOSABLE) ×1
GRASPER SUT TROCAR 14GX15 (MISCELLANEOUS) ×3 IMPLANT
IRRIGATION STRYKERFLOW (MISCELLANEOUS) ×2 IMPLANT
IRRIGATOR STRYKERFLOW (MISCELLANEOUS) ×3
IV LACTATED RINGERS 1000ML (IV SOLUTION) ×3 IMPLANT
IV NS 1000ML (IV SOLUTION) ×1
IV NS 1000ML BAXH (IV SOLUTION) ×2 IMPLANT
KIT PINK PAD W/HEAD ARE REST (MISCELLANEOUS) ×3
KIT PINK PAD W/HEAD ARM REST (MISCELLANEOUS) ×2 IMPLANT
LABEL OR SOLS (LABEL) ×3 IMPLANT
MANIPULATOR VCARE LG CRV RETR (MISCELLANEOUS) ×1 IMPLANT
MANIPULATOR VCARE SML CRV RETR (MISCELLANEOUS) IMPLANT
MANIPULATOR VCARE STD CRV RETR (MISCELLANEOUS) IMPLANT
NS IRRIG 1000ML POUR BTL (IV SOLUTION) ×3 IMPLANT
NS IRRIG 500ML POUR BTL (IV SOLUTION) ×3 IMPLANT
OCCLUDER COLPOPNEUMO (BALLOONS) ×1 IMPLANT
PACK GYN LAPAROSCOPIC (MISCELLANEOUS) ×3 IMPLANT
PAD OB MATERNITY 4.3X12.25 (PERSONAL CARE ITEMS) ×3 IMPLANT
PAD PREP 24X41 OB/GYN DISP (PERSONAL CARE ITEMS) ×3 IMPLANT
SCISSORS METZENBAUM CVD 33 (INSTRUMENTS) ×3 IMPLANT
SET CYSTO W/LG BORE CLAMP LF (SET/KITS/TRAYS/PACK) ×3 IMPLANT
SHEARS HARMONIC ACE PLUS 36CM (ENDOMECHANICALS) ×3 IMPLANT
SLEEVE ENDOPATH XCEL 5M (ENDOMECHANICALS) ×3 IMPLANT
STRAP SAFETY 5IN WIDE (MISCELLANEOUS) ×3 IMPLANT
SURGILUBE 2OZ TUBE FLIPTOP (MISCELLANEOUS) ×3 IMPLANT
SUT ENDO VLOC 180-0-8IN (SUTURE) ×3 IMPLANT
SUT MNCRL 4-0 (SUTURE) ×2
SUT MNCRL 4-0 27XMFL (SUTURE) ×4
SUT VIC AB 0 CT1 27 (SUTURE) ×1
SUT VIC AB 0 CT1 27XCR 8 STRN (SUTURE) ×2 IMPLANT
SUTURE MNCRL 4-0 27XMF (SUTURE) ×4 IMPLANT
SYR 10ML LL (SYRINGE) ×3 IMPLANT
SYR 50ML LL SCALE MARK (SYRINGE) ×3 IMPLANT
TROCAR ENDO BLADELESS 11MM (ENDOMECHANICALS) ×3 IMPLANT
TROCAR XCEL NON-BLD 5MMX100MML (ENDOMECHANICALS) ×3 IMPLANT
TUBING EVAC SMOKE HEATED PNEUM (TUBING) ×3 IMPLANT

## 2019-05-30 NOTE — Transfer of Care (Signed)
Immediate Anesthesia Transfer of Care Note  Patient: Susan Wade  Procedure(s) Performed: TOTAL LAPAROSCOPIC HYSTERECTOMY WITH BILATERAL SALPINGECTOMY (N/A Abdomen) CYSTOSCOPY (N/A )  Patient Location: PACU  Anesthesia Type:General  Level of Consciousness: awake  Airway & Oxygen Therapy: Patient Spontanous Breathing and Patient connected to face mask oxygen  Post-op Assessment: Report given to RN and Post -op Vital signs reviewed and stable  Post vital signs: Reviewed  Last Vitals:  Vitals Value Taken Time  BP 89/51 05/30/19 1557  Temp    Pulse 88 05/30/19 1557  Resp 15 05/30/19 1557  SpO2 100 % 05/30/19 1557  Vitals shown include unvalidated device data.  Last Pain:  Vitals:   05/30/19 1353  TempSrc: Tympanic  PainSc: 0-No pain         Complications: No apparent anesthesia complications

## 2019-05-30 NOTE — Progress Notes (Signed)
   Subjective: Patient reports incisional pain and no problems voiding.  Pain is not well-controlled on current  analgesic regimen.  Reports pain below belly button centrally radiating outwards, feels like period cramps.  No shoulder pain Tolerating po: Yes   Objective: Vital signs in last 24 hours: Temp:  [96.9 F (36.1 C)-99 F (37.2 C)] 98.2 F (36.8 C) (02/11 2049) Pulse Rate:  [66-90] 77 (02/11 2049) Resp:  [12-24] 20 (02/11 2049) BP: (89-135)/(51-81) 111/70 (02/11 1918) SpO2:  [98 %-100 %] 99 % (02/11 2049)    Intake/Output from previous day: No intake/output data recorded.  Physical Examination: General: NAD Pulmonary: no increased work of breathing Abdomen: soft, non-tender, non-distended, incision(s) D/C/I Extremities: no edema Neurologic: normal gait   Labs: Results for orders placed or performed during the hospital encounter of 05/30/19 (from the past 72 hour(s))  Pregnancy, urine POC     Status: None   Collection Time: 05/30/19  1:49 PM  Result Value Ref Range   Preg Test, Ur NEGATIVE NEGATIVE    Comment:        THE SENSITIVITY OF THIS METHODOLOGY IS >24 mIU/mL   ABO/Rh     Status: None   Collection Time: 05/30/19  1:52 PM  Result Value Ref Range   ABO/RH(D)      O POS Performed at [facility], [address] [place], [place] [address]   Pregnancy, urine POC     Status: None   Collection Time: 05/30/19  1:55 PM  Result Value Ref Range   Preg Test, Ur NEGATIVE NEGATIVE    Comment:        THE SENSITIVITY OF THIS METHODOLOGY IS >24 mIU/mL      Assessment:  39 y.o. s/p Day of Surgery Procedure(s) (LRB): TOTAL LAPAROSCOPIC HYSTERECTOMY WITH BILATERAL SALPINGECTOMY (N/A) CYSTOSCOPY (N/A) : stable  Plan: 1) Pain: continue current regimen add K-pad  2) Heme: repeat CBC in Am  3) FEN: general diet, repeat BMP in AM to check BUN/Cr  4) Prophylaxis: intermittent pneumatic compression boots.  5) Disposition: The patient is to be  discharged to home anticipate discharge tomorrow AM.  Vena Austria, MD, [facility], [facility] 05/30/2019, 10:49 PM

## 2019-05-30 NOTE — Anesthesia Preprocedure Evaluation (Signed)
Anesthesia Evaluation  Patient identified by MRN, date of birth, ID band Patient awake    Reviewed: Allergy & Precautions, H&P , NPO status , Patient's Chart, lab work & pertinent test results, reviewed documented beta blocker date and time   History of Anesthesia Complications Negative for: history of anesthetic complications  Airway Mallampati: II  TM Distance: >3 FB Neck ROM: full    Dental  (+) Caps, Dental Advidsory Given, Teeth Intact   Pulmonary neg shortness of breath, neg COPD, neg recent URI, Current Smoker and Patient abstained from smoking.,    Pulmonary exam normal breath sounds clear to auscultation       Cardiovascular Exercise Tolerance: Good negative cardio ROS Normal cardiovascular exam Rhythm:regular Rate:Normal     Neuro/Psych negative neurological ROS  negative psych ROS   GI/Hepatic negative GI ROS, Neg liver ROS,   Endo/Other  negative endocrine ROS  Renal/GU negative Renal ROS  negative genitourinary   Musculoskeletal   Abdominal   Peds  Hematology negative hematology ROS (+)   Anesthesia Other Findings Past Medical History: No date: Scoliosis   Reproductive/Obstetrics negative OB ROS                             Anesthesia Physical Anesthesia Plan  ASA: I  Anesthesia Plan: General   Post-op Pain Management:    Induction: Intravenous  PONV Risk Score and Plan: 2 and Ondansetron, Dexamethasone, Midazolam and Treatment may vary due to age or medical condition  Airway Management Planned: Oral ETT  Additional Equipment:   Intra-op Plan:   Post-operative Plan: Extubation in OR  Informed Consent: I have reviewed the patients History and Physical, chart, labs and discussed the procedure including the risks, benefits and alternatives for the proposed anesthesia with the patient or authorized representative who has indicated his/her understanding and  acceptance.     Dental Advisory Given  Plan Discussed with: Anesthesiologist, CRNA and Surgeon  Anesthesia Plan Comments:         Anesthesia Quick Evaluation

## 2019-05-30 NOTE — Anesthesia Postprocedure Evaluation (Signed)
Anesthesia Post Note  Patient: Susan Wade  Procedure(s) Performed: TOTAL LAPAROSCOPIC HYSTERECTOMY WITH BILATERAL SALPINGECTOMY (N/A Abdomen) CYSTOSCOPY (N/A )  Patient location during evaluation: PACU Anesthesia Type: General Level of consciousness: awake and alert Pain management: pain level controlled Vital Signs Assessment: post-procedure vital signs reviewed and stable Respiratory status: spontaneous breathing, nonlabored ventilation, respiratory function stable and patient connected to nasal cannula oxygen Cardiovascular status: blood pressure returned to baseline and stable Postop Assessment: no apparent nausea or vomiting Anesthetic complications: no     Last Vitals:  Vitals:   05/30/19 1830 05/30/19 1918  BP: 108/65 111/70  Pulse: 70 73  Resp: 18 18  Temp: 36.6 C 37.1 C  SpO2: 100% 100%    Last Pain:  Vitals:   05/30/19 1918  TempSrc: Oral  PainSc:                  Karleen Hampshire

## 2019-05-30 NOTE — Op Note (Signed)
Preoperative Diagnosis: 1) 39 y.o.   Postoperative Diagnosis: 1) 39 y.o.   Operation Performed: Total laparoscopic hysterectomy, bilateral salpingectomy, and cystoscopy  Indication: Abnormal bleeding and dysmenorrhea  Surgeon: Vena Austria, MD  Assistant: Adelene Idler, MD this surgery required a high level surgical assistant with none other readily available  Anesthesia: General  Preoperative Antibiotics: none  Estimated Blood Loss: 25 mL  IV Fluids:  Urine Output::  Drains or Tubes: Foley to gravity drainage  Implants: none  Specimens Removed: Uterus, cervix, and bilateral fallopian tubes  Complications: none  Intraoperative Findings: Normal appearing parous cervix, normal uterus.  Bilateral tubes and ovaries appeared normal, with post tubes showing evidence of prior partial salpingectomy.  Appendix was visualized and normal.  Normal liver edge.  Ureters normal and well away from the site of dissection.  Postoperative cystoscopy revealed an intact bladder dome, no evidence of sutures penetrating the bladder, and bilateral efflux of urine from both ureteral orifices.    Patient Condition: stable  Procedure in Detail:  Patient was taken to the operating room where she was administered general anesthesia.  She was positioned in the dorsal lithotomy position utilizing Allen stirups, prepped and draped in the usual sterile fashion.  Prior to proceeding with procedure a time out was performed.  Attention was turned to the patient's pelvis.  An indwelling foley catheter was placed to decompress the patient's bladder.  An operative speculum was placed to allow visualization of the cervix.  The anterior lip of the cervix was grasped with a single tooth tenaculum, and a large V-care uterine manipulator was placed to allow manipulation of the uterus.  The operative speculum and single tooth tenaculum were then removed.  Attention was turned to the patient's abdomen.   The umbilicus was infiltrated with 1% Sensorcaine, before making a stab incision using an 11 blade scalpel.  A 72mm Excel trocar was then used to gain direct entry into the peritoneal cavity utilizing the camera to visualize progress of the trocar during placement.  Once peritoneal entry had been achieved, insufflation was started and pneumoperitoneum established at a pressure of .    One left and one right lower quadrant site were then injected with 1% Sensorcaine and a stab incision was made using an 11 blade scalpel.  Two additional 67mm Excel trocars were placed through these incisions under direct visualization, and the umbilical trocar was stepped up to an 4mm Excel trocar. General inspection of the abdomen revealed the above noted findings.   The right tube was identified and grasped at its fimbriated end.  The tube was transected from its attachments to the ovary and mesosalpinx using a 3mm Harmonic scalpel.  The utero ovarian ligament was identified ligated and transected using the Harmonic scalpel. The round ligament was then likewise ligated and transected.  The anterior leaf of the broad ligament was dissected down to the level of the internal cervical os and a bladder flap was started.  The posterior leaf of the broad ligament was dissected down to the utero-sacral ligament.  The uterine artery was skeletonized before being ligated and transected using the Harmonic scalpel with cephelad pressure applied to the V-care device to assure lateralization of the ureter.  A bite was then taken with Harmonic medial to transected portio of uterine artery to further lateralize the ureter and vessel off the V-care cup.  The patient left adnexal structures were then dissected in similar fashion.  The bladder flap was completed and the bladder mobilized off  the V-care cup.  An anterior colpotomy was scores and carried around in a clockwise fashion to free the specimen, which was then removed vaginally.   Inspection revealed all pedicles to be hemostatic before proceed with vaginal closure.    The cuff was closed using an Endostitch with a running V-lock load.  The cuff was hemsotatic at the conclusion of closure without visible or palpable defects.     The 56mm trocar site was closed using a PMI and 0-Vicryl suture.  There were no palpable fascial defects and suture was tied down under direct visualization. Pneumoperitoneum was evacuated and trocars were removed.  The 37mm trocar skin was closed using a running subcuticular stitch of 4-0 Monocryl.  All trocar sites were then dressed with surgical skin glue.   The indwelling foley catheter was removed.  Cystoscopy was performed noting and intact bladder dome as well as brisk efflux of urine from bother ureteral orifices.  The cystoscopy was removed and the indwelling foley catheter was replaced.  Sponge needle and instrument counts were correct time two.  The patient tolerated the procedure well and was taken to the recovery room in stable condition.

## 2019-05-30 NOTE — H&P (Signed)
Date of Initial H&P: 05/24/19  History reviewed, patient examined, no change in status, stable for surgery.

## 2019-05-30 NOTE — Anesthesia Procedure Notes (Signed)
Procedure Name: Intubation Date/Time: 05/30/2019 2:25 PM Performed by: Mathews Argyle, CRNA Pre-anesthesia Checklist: Patient identified, Patient being monitored, Timeout performed, Emergency Drugs available and Suction available Patient Re-evaluated:Patient Re-evaluated prior to induction Oxygen Delivery Method: Circle system utilized Preoxygenation: Pre-oxygenation with 100% oxygen Induction Type: IV induction Ventilation: Mask ventilation without difficulty Laryngoscope Size: 3 and McGraph Grade View: Grade I Tube type: Oral Tube size: 7.0 mm Number of attempts: 1 Airway Equipment and Method: Stylet Placement Confirmation: ETT inserted through vocal cords under direct vision,  positive ETCO2 and breath sounds checked- equal and bilateral Secured at: 21 cm Tube secured with: Tape Dental Injury: Teeth and Oropharynx as per pre-operative assessment

## 2019-05-31 DIAGNOSIS — N946 Dysmenorrhea, unspecified: Secondary | ICD-10-CM | POA: Diagnosis not present

## 2019-05-31 LAB — CBC
HCT: 34.4 % — ABNORMAL LOW (ref 36.0–46.0)
Hemoglobin: 11.3 g/dL — ABNORMAL LOW (ref 12.0–15.0)
MCH: 29.1 pg (ref 26.0–34.0)
MCHC: 32.8 g/dL (ref 30.0–36.0)
MCV: 88.7 fL (ref 80.0–100.0)
Platelets: 222 10*3/uL (ref 150–400)
RBC: 3.88 MIL/uL (ref 3.87–5.11)
RDW: 13.6 % (ref 11.5–15.5)
WBC: 14.4 10*3/uL — ABNORMAL HIGH (ref 4.0–10.5)
nRBC: 0 % (ref 0.0–0.2)

## 2019-05-31 LAB — BASIC METABOLIC PANEL
Anion gap: 7 (ref 5–15)
BUN: 7 mg/dL (ref 6–20)
CO2: 25 mmol/L (ref 22–32)
Calcium: 8.6 mg/dL — ABNORMAL LOW (ref 8.9–10.3)
Chloride: 109 mmol/L (ref 98–111)
Creatinine, Ser: 0.58 mg/dL (ref 0.44–1.00)
GFR calc Af Amer: >60 mL/min (ref >60)
GFR calc non Af Amer: >60 mL/min (ref >60)
Glucose, Bld: 150 mg/dL — ABNORMAL HIGH (ref 70–99)
Potassium: 3.7 mmol/L (ref 3.5–5.1)
Sodium: 141 mmol/L (ref 135–145)

## 2019-05-31 MED ORDER — IBUPROFEN 600 MG PO TABS: 600.0000 mg | ORAL_TABLET | Freq: Four times a day (QID) | ORAL | 0 refills | Status: DC | PRN

## 2019-05-31 MED ORDER — OXYCODONE-ACETAMINOPHEN 5-325 MG PO TABS: 1.0000 | ORAL_TABLET | ORAL | 0 refills | Status: DC | PRN

## 2019-05-31 NOTE — Progress Notes (Signed)
Patient discharged home. Discharge instructions, prescriptions and follow up appointment given to and reviewed with patient. Patient verbalized understanding. Patient wheeled out by NT 

## 2019-05-31 NOTE — Discharge Summary (Signed)
Physician Discharge Summary  Patient ID: Susan Wade MRN: [id number] DOB/AGE: Susan Wade 10, 1982 39 y.o.  Admit date: 05/30/2019 Discharge date: 05/31/2019  Admission Diagnoses:  Discharge Diagnoses:  Active Problems:   Abnormal uterine bleeding (AUB)   Discharged Condition: good  Hospital Course: 39 year old admitted for scheduled TLH, BS, cystoscopy for AUB and dysmenorrhea.  Procedure conducted on 8/41/3244 without complications.  Patient had a benign postoperative course other than pain from the umbilical fascial stitch.  She remained hemodynamically stable and afebrile throughout admission.  At the time of discharge the patient was voiding without difficulty, ambulating, tolerating po, and reporting adequate pain control on po analgesics.  POD1 labs revealed appropriate CBC findings, with stable BUN/Cr.  She will follow up in 1 week for routine postoperative follow up.  Consults: None  Significant Diagnostic Studies:  Results for orders placed or performed during the hospital encounter of 05/30/19 (from the past 24 hour(s))  Pregnancy, urine POC     Status: None   Collection Time: 05/30/19  1:49 PM  Result Value Ref Range   Preg Test, Ur NEGATIVE NEGATIVE  ABO/Rh     Status: None   Collection Time: 05/30/19  1:52 PM  Result Value Ref Range   ABO/RH(D)      O POS Performed at [facility], [address] [place], [place] [address]   Pregnancy, urine POC     Status: None   Collection Time: 05/30/19  1:55 PM  Result Value Ref Range   Preg Test, Ur NEGATIVE NEGATIVE  CBC     Status: Abnormal   Collection Time: 05/31/19  7:24 AM  Result Value Ref Range   WBC 14.4 (H) 4.0 - 10.5 K/uL   RBC 3.88 3.87 - 5.11 MIL/uL   Hemoglobin 11.3 (L) 12.0 - 15.0 g/dL   HCT 34.4 (L) 36.0 - 46.0 %   MCV 88.7 80.0 - 100.0 fL   MCH 29.1 26.0 - 34.0 pg   MCHC 32.8 30.0 - 36.0 g/dL   RDW 13.6 11.5 - 15.5 %   Platelets 222 150 - 400 K/uL   nRBC 0.0 0.0 - 0.2 %  Basic metabolic panel      Status: Abnormal   Collection Time: 05/31/19  7:24 AM  Result Value Ref Range   Sodium 141 135 - 145 mmol/L   Potassium 3.7 3.5 - 5.1 mmol/L   Chloride 109 98 - 111 mmol/L   CO2 25 22 - 32 mmol/L   Glucose, Bld 150 (H) 70 - 99 mg/dL   BUN 7 6 - 20 mg/dL   Creatinine, Ser 0.58 0.44 - 1.00 mg/dL   Calcium 8.6 (L) 8.9 - 10.3 mg/dL   GFR calc non Af Amer >60 >60 mL/min   GFR calc Af Amer >60 >60 mL/min   Anion gap 7 5 - 15    Treatments: surgery: TLH, BS, cystoscopy 05/31/2019   Discharge Exam: Blood pressure (!) 103/57, pulse 68, temperature 99.1 F (37.3 C), temperature source Oral, resp. rate 18, last menstrual period 05/30/2019, SpO2 98 %.  Temp:  [96.9 F (36.1 C)-99.1 F (37.3 C)] 99.1 F (37.3 C) (02/12 0748) Pulse Rate:  [66-90] 68 (02/12 0748) Resp:  [12-24] 18 (02/12 0748) BP: (89-135)/(51-81) 103/57 (02/12 0748) SpO2:  [98 %-100 %] 98 % (02/12 0748)  General appearance: alert, appears stated age and no distress Resp: no increased work of breathing GI: soft, non-tender; bowel sounds normal; no masses,  no organomegaly Extremities: extremities normal, atraumatic, no cyanosis  or edema Incision/Wound: Trocar sites D/C/I  Disposition: Discharge disposition: 01-Home or Self Care       Discharge Instructions    Call MD for:   Complete by: As directed    Heavy vaginal bleeding greater than 1 pad an hour   Call MD for:  difficulty breathing, headache or visual disturbances   Complete by: As directed    Call MD for:  extreme fatigue   Complete by: As directed    Call MD for:  hives   Complete by: As directed    Call MD for:  persistant dizziness or light-headedness   Complete by: As directed    Call MD for:  persistant nausea and vomiting   Complete by: As directed    Call MD for:  redness, tenderness, or signs of infection (pain, swelling, redness, odor or green/yellow discharge around incision site)   Complete by: As directed    Call MD for:  severe  uncontrolled pain   Complete by: As directed    Call MD for:  temperature >100.4   Complete by: As directed    Diet general   Complete by: As directed    Discharge wound care:   Complete by: As directed    You may apply a light dressing for minor discharge from the incision or to keep waistbands or clothing from rubbing.   You may shower, use soap on your incision.  Avoid baths or soaking the incision in the first 6 weeks following your surgery. No intercourse or tampons for the first 6 weeks until you have been cleared at your 6 week postoperative visit.   Driving restriction   Complete by: As directed    Avoid driving for at least 1 weeks or while taking prescription narcotics.   Lifting restrictions   Complete by: As directed    Weight restriction of 10lbs for 6 weeks.     Allergies as of 05/31/2019   No Known Allergies     Medication List    TAKE these medications   acetaminophen 650 MG CR tablet Commonly known as: TYLENOL Take 650 mg by mouth every 8 (eight) hours as needed for pain.   ibuprofen 600 MG tablet Commonly known as: ADVIL Take 1 tablet (600 mg total) by mouth every 6 (six) hours as needed for mild pain or cramping.   oxyCODONE-acetaminophen 5-325 MG tablet Commonly known as: PERCOCET/ROXICET Take 1-2 tablets by mouth every 4 (four) hours as needed for moderate pain.            Discharge Care Instructions  (From admission, onward)         Start     Ordered   05/31/19 0000  Discharge wound care:    Comments: You may apply a light dressing for minor discharge from the incision or to keep waistbands or clothing from rubbing.   You may shower, use soap on your incision.  Avoid baths or soaking the incision in the first 6 weeks following your surgery. No intercourse or tampons for the first 6 weeks until you have been cleared at your 6 week postoperative visit.   05/31/19 0810         Follow-up Information    Vena Austria, MD In 1 week.    Specialty: Obstetrics and Gynecology Why: For wound re-check Contact information: [address] [place] [address] [phone]           Signed: Vena Austria 05/31/2019, 8:12 AM

## 2019-06-02 ENCOUNTER — Emergency Department: Payer: 59

## 2019-06-02 ENCOUNTER — Other Ambulatory Visit: Payer: Self-pay

## 2019-06-02 ENCOUNTER — Emergency Department
Admission: EM | Admit: 2019-06-02 | Discharge: 2019-06-03 | Disposition: A | Discharge status: Home / Self Care | Payer: 59 | Attending: Emergency Medicine | Admitting: Emergency Medicine

## 2019-06-02 ENCOUNTER — Encounter: Payer: Self-pay | Admitting: *Deleted

## 2019-06-02 DIAGNOSIS — R102 Pelvic and perineal pain: Secondary | ICD-10-CM | POA: Diagnosis present

## 2019-06-02 DIAGNOSIS — F17210 Nicotine dependence, cigarettes, uncomplicated: Secondary | ICD-10-CM | POA: Insufficient documentation

## 2019-06-02 DIAGNOSIS — Z20822 Contact with and (suspected) exposure to covid-19: Secondary | ICD-10-CM | POA: Diagnosis not present

## 2019-06-02 DIAGNOSIS — R509 Fever, unspecified: Secondary | ICD-10-CM | POA: Insufficient documentation

## 2019-06-02 DIAGNOSIS — G8918 Other acute postprocedural pain: Secondary | ICD-10-CM | POA: Insufficient documentation

## 2019-06-02 DIAGNOSIS — Z90710 Acquired absence of both cervix and uterus: Secondary | ICD-10-CM | POA: Diagnosis not present

## 2019-06-02 LAB — CBC WITH DIFFERENTIAL/PLATELET
Abs Immature Granulocytes: 0.05 10*3/uL (ref 0.00–0.07)
Basophils Absolute: 0.1 10*3/uL (ref 0.0–0.1)
Basophils Relative: 0 %
Eosinophils Absolute: 0 10*3/uL (ref 0.0–0.5)
Eosinophils Relative: 0 %
HCT: 36 % (ref 36.0–46.0)
Hemoglobin: 11.9 g/dL — ABNORMAL LOW (ref 12.0–15.0)
Immature Granulocytes: 0 %
Lymphocytes Relative: 4 %
Lymphs Abs: 0.5 10*3/uL — ABNORMAL LOW (ref 0.7–4.0)
MCH: 28.9 pg (ref 26.0–34.0)
MCHC: 33.1 g/dL (ref 30.0–36.0)
MCV: 87.4 fL (ref 80.0–100.0)
Monocytes Absolute: 0.6 10*3/uL (ref 0.1–1.0)
Monocytes Relative: 4 %
Neutro Abs: 12 10*3/uL — ABNORMAL HIGH (ref 1.7–7.7)
Neutrophils Relative %: 92 %
Platelets: 204 10*3/uL (ref 150–400)
RBC: 4.12 MIL/uL (ref 3.87–5.11)
RDW: 13.5 % (ref 11.5–15.5)
WBC: 13.2 10*3/uL — ABNORMAL HIGH (ref 4.0–10.5)
nRBC: 0 % (ref 0.0–0.2)

## 2019-06-02 LAB — COMPREHENSIVE METABOLIC PANEL
ALT: 48 U/L — ABNORMAL HIGH (ref 0–44)
AST: 29 U/L (ref 15–41)
Albumin: 4.1 g/dL (ref 3.5–5.0)
Alkaline Phosphatase: 48 U/L (ref 38–126)
Anion gap: 9 (ref 5–15)
BUN: 8 mg/dL (ref 6–20)
CO2: 24 mmol/L (ref 22–32)
Calcium: 9.2 mg/dL (ref 8.9–10.3)
Chloride: 103 mmol/L (ref 98–111)
Creatinine, Ser: 0.55 mg/dL (ref 0.44–1.00)
GFR calc Af Amer: >60 mL/min (ref >60)
GFR calc non Af Amer: >60 mL/min (ref >60)
Glucose, Bld: 113 mg/dL — ABNORMAL HIGH (ref 70–99)
Potassium: 3.1 mmol/L — ABNORMAL LOW (ref 3.5–5.1)
Sodium: 136 mmol/L (ref 135–145)
Total Bilirubin: 0.9 mg/dL (ref 0.3–1.2)
Total Protein: 7.4 g/dL (ref 6.5–8.1)

## 2019-06-02 LAB — LACTIC ACID, PLASMA: Lactic Acid, Venous: 1.4 mmol/L (ref 0.5–1.9)

## 2019-06-02 MED ORDER — POLYETHYLENE GLYCOL 3350 17 G PO PACK: 17.0000 g | PACK | Freq: Every day | ORAL | 0 refills | Status: DC

## 2019-06-02 MED ORDER — IOHEXOL 300 MG/ML  SOLN
75.0000 mL | Freq: Once | INTRAMUSCULAR | Status: AC | PRN
  Administered 2019-06-02: 23:00:00 75 mL via INTRAVENOUS

## 2019-06-02 MED ORDER — MORPHINE SULFATE (PF) 4 MG/ML IV SOLN: 4.0000 mg | Freq: Once | INTRAVENOUS | Status: DC

## 2019-06-02 MED ORDER — SODIUM CHLORIDE 0.9 % IV BOLUS
1000.0000 mL | Freq: Once | INTRAVENOUS | Status: AC
  Administered 2019-06-02: 20:00:00 1000 mL via INTRAVENOUS

## 2019-06-02 MED ORDER — ONDANSETRON HCL 4 MG/2ML IJ SOLN
4.0000 mg | Freq: Once | INTRAMUSCULAR | Status: AC
  Administered 2019-06-02: 20:00:00 4 mg via INTRAVENOUS
  Filled 2019-06-02: qty 2

## 2019-06-02 MED ORDER — OXYCODONE-ACETAMINOPHEN 5-325 MG PO TABS
1.0000 | ORAL_TABLET | Freq: Once | ORAL | Status: AC
  Administered 2019-06-02: 20:00:00 1 via ORAL
  Filled 2019-06-02: qty 1

## 2019-06-02 MED ORDER — SODIUM CHLORIDE 0.9% FLUSH: 3.0000 mL | Freq: Once | INTRAVENOUS | Status: DC

## 2019-06-02 MED ORDER — IOHEXOL 9 MG/ML PO SOLN
500.0000 mL | Freq: Two times a day (BID) | ORAL | Status: DC | PRN
  Administered 2019-06-02: 21:00:00 500 mL via ORAL

## 2019-06-02 NOTE — ED Notes (Addendum)
Pt reports total laparoscopic hysterectomy reported on Thursday, discharged on Friday. Reported max temp 102 at home. Reports taking tylenol approx 4 hours ago with no relief. Reports abdominal cramping at this time. Denies difficulty urinating, no BM. Reports nausea, no vomiting. Reports going through 1 pad for vaginal bleeding that started today.  MD Siadecki at bedside

## 2019-06-02 NOTE — ED Provider Notes (Signed)
[facility] Emergency Department Provider Note ____________________________________________   First MD Initiated Contact with Patient 06/02/19 1942     (approximate)  I have reviewed the triage vital signs and the nursing notes.   HISTORY  Chief Complaint Pelvic Pain    HPI Susan Wade is a 39 y.o. female with PMH as noted below and status post laparoscopic hysterectomy and bilateral salpingectomy 3 days ago (with no complications during her hospitalization) who presents with a fever, acute onset today, measured to 102 at home, and associated with lower abdominal pain and nausea.  The patient states that she has not had a bowel movement since the surgery.  She denies dysuria.  She has had a small amount of vaginal bleeding.  Past Medical History:  Diagnosis Date  . Scoliosis     Patient Active Problem List   Diagnosis Date Noted  . Abnormal uterine bleeding (AUB) 05/30/2019  . Scoliosis 03/21/2019  . Toenail deformity 03/21/2019  . Family history of colon cancer in father 03/21/2019    Past Surgical History:  Procedure Laterality Date  . COLONOSCOPY    . CYSTOSCOPY N/A 05/30/2019   Procedure: CYSTOSCOPY;  Surgeon: Vena Austria, MD;  Location: [facility] ORS;  Service: Gynecology;  Laterality: N/A;  . TOTAL LAPAROSCOPIC HYSTERECTOMY WITH SALPINGECTOMY N/A 05/30/2019   Procedure: TOTAL LAPAROSCOPIC HYSTERECTOMY WITH BILATERAL SALPINGECTOMY;  Surgeon: Vena Austria, MD;  Location: [facility] ORS;  Service: Gynecology;  Laterality: N/A;  . TUBAL LIGATION  2011  . UPPER GI ENDOSCOPY      Prior to Admission medications   Medication Sig Start Date End Date Taking? Authorizing Provider  acetaminophen (TYLENOL) 650 MG CR tablet Take 650 mg by mouth every 8 (eight) hours as needed for pain.    [provider]  ibuprofen (ADVIL) 600 MG tablet Take 1 tablet (600 mg total) by mouth every 6 (six) hours as needed for mild pain or cramping. 05/31/19    Vena Austria, MD  oxyCODONE-acetaminophen (PERCOCET/ROXICET) 5-325 MG tablet Take 1-2 tablets by mouth every 4 (four) hours as needed for moderate pain. 05/31/19   Vena Austria, MD  polyethylene glycol (MIRALAX MIX-IN PAX) 17 g packet Take 17 g by mouth daily. 06/02/19   Dionne Bucy, MD    Allergies Patient has no known allergies.  Family History  Problem Relation Age of Onset  . Hypertension Mother   . Hyperlipidemia Mother   . Colon cancer Father 45  . Rectal cancer Father   . Hyperlipidemia Maternal Grandfather   . Hypertension Maternal Grandfather   . Diabetes Paternal Grandmother   . Heart disease Paternal Grandmother   . Hyperlipidemia Paternal Grandmother   . Hypertension Paternal Grandmother   . Colon cancer Paternal Grandfather   . Colon polyps Sister 3  . Colon cancer Cousin 35  . Colon cancer Cousin 19    Social History Social History   Tobacco Use  . Smoking status: Current Every Day Smoker    Packs/day: 0.50    Years: 17.00    Pack years: 8.50    Types: Cigarettes  . Smokeless tobacco: Never Used  Substance Use Topics  . Alcohol use: Yes    Comment: glass of wine a few nights a week  . Drug use: Never    Review of Systems  Constitutional: Positive for fever. Eyes: No redness. ENT: No sore throat. Cardiovascular: Denies chest pain. Respiratory: Denies shortness of breath. Gastrointestinal: Positive for nausea. Genitourinary: Negative for dysuria.  Musculoskeletal: Negative for back  pain. Skin: Negative for rash. Neurological: Negative for headache.   ____________________________________________   PHYSICAL EXAM:  VITAL SIGNS: ED Triage Vitals [06/02/19 1917]  Enc Vitals Group     BP 118/68     Pulse Rate (!) 110     Resp 16     Temp 100.2 F (37.9 C)     Temp Source Oral     SpO2 100 %     Weight 127 lb (57.6 kg)     Height 5\' 6"  (1.676 m)     Head Circumference      Peak Flow      Pain Score      Pain Loc       Pain Edu?      Excl. in [place]?     Constitutional: Alert and oriented.  Relatively well appearing and in no acute distress. Eyes: Conjunctivae are normal.  No scleral icterus. Head: Atraumatic. Nose: No congestion/rhinnorhea. Mouth/Throat: Mucous membranes are moist.   Neck: Normal range of motion.  Cardiovascular:  Good peripheral circulation. Respiratory: Normal respiratory effort.  No retractions.  Gastrointestinal: Moderate bilateral lower abdominal tenderness.  Incision sites appear clean, dry, intact.  No distention.  Genitourinary: No flank tenderness. Musculoskeletal:  Extremities warm and well perfused.  Neurologic:  Normal speech and language. No gross focal neurologic deficits are appreciated.  Skin:  Skin is warm and dry. No rash noted. Psychiatric: Mood and affect are normal. Speech and behavior are normal.  ____________________________________________   LABS (all labs ordered are listed, but only abnormal results are displayed)  Labs Reviewed  COMPREHENSIVE METABOLIC PANEL - Abnormal; Notable for the following components:      Result Value   Potassium 3.1 (*)    Glucose, Bld 113 (*)    ALT 48 (*)    All other components within normal limits  CBC WITH DIFFERENTIAL/PLATELET - Abnormal; Notable for the following components:   WBC 13.2 (*)    Hemoglobin 11.9 (*)    Neutro Abs 12.0 (*)    Lymphs Abs 0.5 (*)    All other components within normal limits  LACTIC ACID, PLASMA  URINALYSIS, COMPLETE (UACMP) WITH MICROSCOPIC   ____________________________________________  EKG   ____________________________________________  RADIOLOGY  CT abdomen: Moderate fecal matter in the colon with no no acute abnormality or evidence of postsurgical complication.  ____________________________________________   PROCEDURES  Procedure(s) performed: No  Procedures  Critical Care performed: No ____________________________________________   INITIAL IMPRESSION / ASSESSMENT  AND PLAN / ED COURSE  Pertinent labs & imaging results that were available during my care of the patient were reviewed by me and considered in my medical decision making (see chart for details).  39 year old female with PMH as noted above and status post a laparoscopic hysterectomy 3 days ago presents with a fever and bilateral lower abdominal pain.  She also reports constipation.  She has had nausea but no vomiting.  I reviewed the past medical records in [place].  The patient's surgery and hospital course were uncomplicated.  She was discharged 2 days ago.  On exam, the patient is overall well-appearing.  Her vital signs are normal except for a low-grade temperature and tachycardia.  The abdomen is soft and the incision sites are clean and intact, but the patient has tenderness in bilateral upper quadrants.  We will obtain a lab work-up and a CT to rule out intra-abdominal infection or other postsurgical complication.  I discussed the case with Dr. Nechama Guard from OB/GYN.  ----------------------------------------- 11:42 PM on  06/02/2019 -----------------------------------------  Labs are unremarkable, and the CT shows no acute findings other than moderate fecal matter in the colon.  We will contact Dr. Gaynelle Arabian to discuss, but anticipate discharge home.  The urinalysis is still pending.  I signed the patient out to the oncoming physician Dr. Lenard Lance.  ____________________________________________   FINAL CLINICAL IMPRESSION(S) / ED DIAGNOSES  Final diagnoses:  Postoperative pain      NEW MEDICATIONS STARTED DURING THIS VISIT:  New Prescriptions   POLYETHYLENE GLYCOL (MIRALAX MIX-IN PAX) 17 G PACKET    Take 17 g by mouth daily.     Note:  This document was prepared using Dragon voice recognition software and may include unintentional dictation errors.    Dionne Bucy, MD 06/02/19 [phone]

## 2019-06-02 NOTE — ED Notes (Signed)
Pt transported to CT ?

## 2019-06-02 NOTE — ED Triage Notes (Signed)
PT to ED reporting a total laparoscopic hysterectomy on Thursday of last week without known complications. Pt started having a small amount of vaginal bleeding today with pelvic and lower abd pain and a fever of 102. Pt took prescribed percocet 4 hours ago. Nausea without vomiting. NO changes in urine or BM.

## 2019-06-03 LAB — URINE CULTURE

## 2019-06-03 LAB — SURGICAL PATHOLOGY

## 2019-06-03 LAB — URINALYSIS, COMPLETE (UACMP) WITH MICROSCOPIC
Bilirubin Urine: NEGATIVE
Glucose, UA: NEGATIVE mg/dL
Ketones, ur: 5 mg/dL — AB
Leukocytes,Ua: NEGATIVE
Nitrite: NEGATIVE
Protein, ur: NEGATIVE mg/dL
Specific Gravity, Urine: 1.044 — ABNORMAL HIGH (ref 1.005–1.030)
pH: 7 (ref 5.0–8.0)

## 2019-06-03 LAB — POC SARS CORONAVIRUS 2 AG -  ED: SARS Coronavirus 2 Ag: NEGATIVE

## 2019-06-03 MED ORDER — FOSFOMYCIN TROMETHAMINE 3 G PO PACK
3.0000 g | PACK | Freq: Once | ORAL | Status: AC
  Administered 2019-06-03: 01:00:00 3 g via ORAL
  Filled 2019-06-03: qty 3

## 2019-06-03 NOTE — ED Provider Notes (Signed)
-----------------------------------------   12:53 AM on 06/03/2019 -----------------------------------------  Patient's CT is essentially negative, Covid negative.  Discussed patient with Dr. Jerene Pitch, as long as the patient's pain is controlled she could be safely discharged home from a GYN standpoint.  Urinalysis has resulted showing 21-50 red cells rare bacteria.  Appears to be possibly contaminated.  We will send a urine culture but we will cover with a one-time dose of fosfomycin given the patient's initial low-grade temperature.  Patient agreeable to plan of care.  We will have the patient follow-up with OB/GYN.  Discussed return precautions.   Minna Antis, MD 06/03/19 [phone]

## 2019-06-07 ENCOUNTER — Encounter: Payer: Self-pay | Admitting: Obstetrics and Gynecology

## 2019-06-07 ENCOUNTER — Other Ambulatory Visit: Payer: Self-pay

## 2019-06-07 ENCOUNTER — Ambulatory Visit (INDEPENDENT_AMBULATORY_CARE_PROVIDER_SITE_OTHER): Payer: PRIVATE HEALTH INSURANCE | Admitting: Obstetrics and Gynecology

## 2019-06-07 VITALS — BP 124/72 | HR 72 | Wt 125.0 lb

## 2019-06-07 DIAGNOSIS — N938 Other specified abnormal uterine and vaginal bleeding: Secondary | ICD-10-CM

## 2019-06-07 DIAGNOSIS — N800 Endometriosis of uterus: Secondary | ICD-10-CM

## 2019-06-07 DIAGNOSIS — Z4889 Encounter for other specified surgical aftercare: Secondary | ICD-10-CM

## 2019-06-07 MED ORDER — OXYCODONE-ACETAMINOPHEN 5-325 MG PO TABS: 1.0000 | ORAL_TABLET | ORAL | 0 refills | Status: DC | PRN

## 2019-06-07 NOTE — Progress Notes (Signed)
Postoperative Follow-up Patient presents post op from [place], BS, cystoscopy 1weeks ago for abnormal uterine bleeding.  Subjective: Patient reports some improvement in her preop symptoms. Eating a regular diet without difficulty. Pain is controlled with current analgesics. Medications being used: ibuprofen and percocet.  Activity: normal activities of daily living.  Did have ER visit POD3 for low grade fever abdominal pain, with negative CT A/P, normal WBC, normal BUN/Cr and was treated for presumptive UTI.  Objective: Blood pressure 124/72, pulse 72, weight 125 lb (56.7 kg), last menstrual period 05/30/2019.  General: NAD Pulmonary: no increased work of breathing Abdomen: soft, non-tender, non-distended, incision(s) D/C/I Extremities: no edema Neurologic: normal gait    Admission on 06/02/2019, Discharged on 06/03/2019  Component Date Value Ref Range Status  . Lactic Acid, Venous 06/02/2019 1.4  0.5 - 1.9 mmol/L Final   Performed at [facility], [address] [place], [place] [address]  . Sodium 06/02/2019 136  135 - 145 mmol/L Final  . Potassium 06/02/2019 3.1* 3.5 - 5.1 mmol/L Final  . Chloride 06/02/2019 103  98 - 111 mmol/L Final  . CO2 06/02/2019 24  22 - 32 mmol/L Final  . Glucose, Bld 06/02/2019 113* 70 - 99 mg/dL Final  . BUN 06/02/2019 8  6 - 20 mg/dL Final  . Creatinine, Ser 06/02/2019 0.55  0.44 - 1.00 mg/dL Final  . Calcium 06/02/2019 9.2  8.9 - 10.3 mg/dL Final  . Total Protein 06/02/2019 7.4  6.5 - 8.1 g/dL Final  . Albumin 06/02/2019 4.1  3.5 - 5.0 g/dL Final  . AST 06/02/2019 29  15 - 41 U/L Final  . ALT 06/02/2019 48* 0 - 44 U/L Final  . Alkaline Phosphatase 06/02/2019 48  38 - 126 U/L Final  . Total Bilirubin 06/02/2019 0.9  0.3 - 1.2 mg/dL Final  . GFR calc non Af Amer 06/02/2019 >60  >60 mL/min Final  . GFR calc Af Amer 06/02/2019 >60  >60 mL/min Final  . Anion gap 06/02/2019 9  5 - 15 Final   Performed at [facility], [address], [place], [place] [address]  . WBC 06/02/2019 13.2* 4.0 - 10.5 K/uL Final  . RBC 06/02/2019 4.12  3.87 - 5.11 MIL/uL Final  . Hemoglobin 06/02/2019 11.9* 12.0 - 15.0 g/dL Final  . HCT 06/02/2019 36.0  36.0 - 46.0 % Final  . MCV 06/02/2019 87.4  80.0 - 100.0 fL Final  . MCH 06/02/2019 28.9  26.0 - 34.0 pg Final  . MCHC 06/02/2019 33.1  30.0 - 36.0 g/dL Final  . RDW 06/02/2019 13.5  11.5 - 15.5 % Final  . Platelets 06/02/2019 204  150 - 400 K/uL Final  . nRBC 06/02/2019 0.0  0.0 - 0.2 % Final  . Neutrophils Relative % 06/02/2019 92  % Final  . Neutro Abs 06/02/2019 12.0* 1.7 - 7.7 K/uL Final  . Lymphocytes Relative 06/02/2019 4  % Final  . Lymphs Abs 06/02/2019 0.5* 0.7 - 4.0 K/uL Final  . Monocytes Relative 06/02/2019 4  % Final  . Monocytes Absolute 06/02/2019 0.6  0.1 - 1.0 K/uL Final  . Eosinophils Relative 06/02/2019 0  % Final  . Eosinophils Absolute 06/02/2019 0.0  0.0 - 0.5 K/uL Final  . Basophils Relative 06/02/2019 0  % Final  . Basophils Absolute 06/02/2019 0.1  0.0 - 0.1 K/uL Final  . Immature Granulocytes 06/02/2019 0  % Final  . Abs Immature Granulocytes 06/02/2019 0.05  0.00 - 0.07  K/uL Final   Performed at [facility], [address] [place], [place] [address]  . Color, Urine 06/03/2019 YELLOW* YELLOW Final  . APPearance 06/03/2019 CLEAR* CLEAR Final  . Specific Gravity, Urine 06/03/2019 1.044* 1.005 - 1.030 Final  . pH 06/03/2019 7.0  5.0 - 8.0 Final  . Glucose, UA 06/03/2019 NEGATIVE  NEGATIVE mg/dL Final  . Hgb urine dipstick 06/03/2019 MODERATE* NEGATIVE Final  . Bilirubin Urine 06/03/2019 NEGATIVE  NEGATIVE Final  . Ketones, ur 06/03/2019 5* NEGATIVE mg/dL Final  . Protein, ur 31/54/0086 NEGATIVE  NEGATIVE mg/dL Final  . Nitrite 76/19/5093 NEGATIVE  NEGATIVE Final  . Glori Luis 06/03/2019 NEGATIVE  NEGATIVE Final  . RBC / HPF 06/03/2019 21-50  0 - 5 RBC/hpf Final  . WBC, UA 06/03/2019 0-5  0 - 5 WBC/hpf Final  . Bacteria, UA 06/03/2019  RARE* NONE SEEN Final  . Squamous Epithelial / LPF 06/03/2019 11-20  0 - 5 Final  . Mucus 06/03/2019 PRESENT   Final   Performed at [facility], [address] [place], [place] [address]  . SARS Coronavirus 2 Ag 06/03/2019 Negative  Negative Final  . Specimen Description 06/03/2019    Final                   Value:URINE, RANDOM Performed at [facility], [address], [place], [place] [address]   . Special Requests 06/03/2019    Final                   Value:NONE Performed at [facility], [address] [place] [place], [place] [address]   . Culture 06/03/2019 MULTIPLE SPECIES PRESENT, SUGGEST RECOLLECTION*  Final  . Report Status 06/03/2019 06/03/2019 FINAL   Final    Assessment: 39 y.o. s/p TLH, BS, cystoscopy stable  Plan: Patient has done well after surgery with no apparent complications.  I have discussed the post-operative course to date, and the expected progress moving forward.  The patient understands what complications to be concerned about.  I will see the patient in routine follow up, or sooner if needed.    Activity plan: No heavy lifting, no intercourse  Refill percocet  Return in about 5 weeks (around 07/12/2019) for 6 week postop.    Vena Austria, MD, [facility], [facility] 06/07/2019, 11:07 AM

## 2019-06-10 ENCOUNTER — Other Ambulatory Visit: Payer: Self-pay | Admitting: Obstetrics & Gynecology

## 2019-06-10 MED ORDER — SULFAMETHOXAZOLE-TRIMETHOPRIM 800-160 MG PO TABS: 1.0000 | ORAL_TABLET | Freq: Two times a day (BID) | ORAL | 0 refills | Status: DC

## 2019-06-10 MED ORDER — FLUCONAZOLE 150 MG PO TABS: 150.0000 mg | ORAL_TABLET | Freq: Once | ORAL | 3 refills | Status: AC

## 2019-06-10 NOTE — Telephone Encounter (Signed)
Since she had surg, pls send to MD. Susan Wade

## 2019-06-11 ENCOUNTER — Other Ambulatory Visit: Payer: Self-pay | Admitting: Obstetrics and Gynecology

## 2019-06-11 MED ORDER — FLUCONAZOLE 150 MG PO TABS: 150.0000 mg | ORAL_TABLET | Freq: Once | ORAL | 0 refills | Status: AC

## 2019-06-11 NOTE — Progress Notes (Signed)
d 

## 2019-06-17 ENCOUNTER — Other Ambulatory Visit: Payer: Self-pay

## 2019-06-17 ENCOUNTER — Ambulatory Visit (INDEPENDENT_AMBULATORY_CARE_PROVIDER_SITE_OTHER): Payer: PRIVATE HEALTH INSURANCE | Admitting: Obstetrics and Gynecology

## 2019-06-17 ENCOUNTER — Telehealth: Payer: Self-pay

## 2019-06-17 ENCOUNTER — Encounter: Payer: Self-pay | Admitting: Obstetrics and Gynecology

## 2019-06-17 VITALS — BP 122/78 | Ht 66.0 in | Wt 124.0 lb

## 2019-06-17 DIAGNOSIS — Z4889 Encounter for other specified surgical aftercare: Secondary | ICD-10-CM

## 2019-06-17 NOTE — Telephone Encounter (Signed)
Pt called triage on 06/14/19 had hysterectomy on the feb 11th . Reports spotting on and off and on the 26th had increased bleeding, No fever passing some clots, do you want pt to come in for exam?

## 2019-06-17 NOTE — Telephone Encounter (Signed)
Please schedule with ams

## 2019-06-17 NOTE — Telephone Encounter (Signed)
yes

## 2019-06-17 NOTE — Telephone Encounter (Signed)
Follow up today.

## 2019-06-17 NOTE — Telephone Encounter (Signed)
Patient is schedule 06/17/19 with AMS

## 2019-06-17 NOTE — Telephone Encounter (Signed)
Patient is schedule for 3/1/ 21 at 2:20

## 2019-06-17 NOTE — Progress Notes (Signed)
Postoperative Follow-up Patient presents post op from [place], BS, cystoscopy 1weeks ago for AUB and dysmenorrhea with final pathology showing adenomyosis.  Subjective: Patient reports marked improvement in her preop symptoms. Eating a regular diet without difficulty. The patient is not having any pain.  Activity: normal activities of daily living.  She noted some vaginal bleeding that developed over the weekend, no pain on fevers.  Was bright red now mostly brown but still some lighter red when she wipes after toileting.  No trauma, intercourse, or other associated event  Objective: Blood pressure 122/78, height 5\' 6"  (1.676 m), weight 124 lb (56.2 kg), last menstrual period 05/30/2019.  General: NAD Pulmonary: no increased work of breathing Abdomen: soft, non-tender, non-distended, incision(s) D/C/I GU: normal external female genitalia.  The vaginal cuff appears intact.  There is some granulation tissue noted centrally with some bleeding.  The suture line is palpable without defects noted.   Extremities: no edema Neurologic: normal gait    Admission on 06/02/2019, Discharged on 06/03/2019  Component Date Value Ref Range Status  . Lactic Acid, Venous 06/02/2019 1.4  0.5 - 1.9 mmol/L Final   Performed at [facility], [address] [place], [place] [address]  . Sodium 06/02/2019 136  135 - 145 mmol/L Final  . Potassium 06/02/2019 3.1* 3.5 - 5.1 mmol/L Final  . Chloride 06/02/2019 103  98 - 111 mmol/L Final  . CO2 06/02/2019 24  22 - 32 mmol/L Final  . Glucose, Bld 06/02/2019 113* 70 - 99 mg/dL Final  . BUN 06/02/2019 8  6 - 20 mg/dL Final  . Creatinine, Ser 06/02/2019 0.55  0.44 - 1.00 mg/dL Final  . Calcium 06/02/2019 9.2  8.9 - 10.3 mg/dL Final  . Total Protein 06/02/2019 7.4  6.5 - 8.1 g/dL Final  . Albumin 06/02/2019 4.1  3.5 - 5.0 g/dL Final  . AST 06/02/2019 29  15 - 41 U/L Final  . ALT 06/02/2019 48* 0 - 44 U/L Final  . Alkaline Phosphatase 06/02/2019 48  38  - 126 U/L Final  . Total Bilirubin 06/02/2019 0.9  0.3 - 1.2 mg/dL Final  . GFR calc non Af Amer 06/02/2019 >60  >60 mL/min Final  . GFR calc Af Amer 06/02/2019 >60  >60 mL/min Final  . Anion gap 06/02/2019 9  5 - 15 Final   Performed at [facility], [address], [place],  [address]  . WBC 06/02/2019 13.2* 4.0 - 10.5 K/uL Final  . RBC 06/02/2019 4.12  3.87 - 5.11 MIL/uL Final  . Hemoglobin 06/02/2019 11.9* 12.0 - 15.0 g/dL Final  . HCT 06/02/2019 36.0  36.0 - 46.0 % Final  . MCV 06/02/2019 87.4  80.0 - 100.0 fL Final  . MCH 06/02/2019 28.9  26.0 - 34.0 pg Final  . MCHC 06/02/2019 33.1  30.0 - 36.0 g/dL Final  . RDW 06/02/2019 13.5  11.5 - 15.5 % Final  . Platelets 06/02/2019 204  150 - 400 K/uL Final  . nRBC 06/02/2019 0.0  0.0 - 0.2 % Final  . Neutrophils Relative % 06/02/2019 92  % Final  . Neutro Abs 06/02/2019 12.0* 1.7 - 7.7 K/uL Final  . Lymphocytes Relative 06/02/2019 4  % Final  . Lymphs Abs 06/02/2019 0.5* 0.7 - 4.0 K/uL Final  . Monocytes Relative 06/02/2019 4  % Final  . Monocytes Absolute 06/02/2019 0.6  0.1 - 1.0 K/uL Final  . Eosinophils Relative 06/02/2019 0  % Final  . Eosinophils  Absolute 06/02/2019 0.0  0.0 - 0.5 K/uL Final  . Basophils Relative 06/02/2019 0  % Final  . Basophils Absolute 06/02/2019 0.1  0.0 - 0.1 K/uL Final  . Immature Granulocytes 06/02/2019 0  % Final  . Abs Immature Granulocytes 06/02/2019 0.05  0.00 - 0.07 K/uL Final   Performed at [facility], [address] [place] [place], [place] [address]  . Color, Urine 06/03/2019 YELLOW* YELLOW Final  . APPearance 06/03/2019 CLEAR* CLEAR Final  . Specific Gravity, Urine 06/03/2019 1.044* 1.005 - 1.030 Final  . pH 06/03/2019 7.0  5.0 - 8.0 Final  . Glucose, UA 06/03/2019 NEGATIVE  NEGATIVE mg/dL Final  . Hgb urine dipstick 06/03/2019 MODERATE* NEGATIVE Final  . Bilirubin Urine 06/03/2019 NEGATIVE  NEGATIVE Final  . Ketones, ur 06/03/2019 5* NEGATIVE mg/dL Final  . Protein, ur  00/93/8182 NEGATIVE  NEGATIVE mg/dL Final  . Nitrite 99/37/1696 NEGATIVE  NEGATIVE Final  . Glori Luis 06/03/2019 NEGATIVE  NEGATIVE Final  . RBC / HPF 06/03/2019 21-50  0 - 5 RBC/hpf Final  . WBC, UA 06/03/2019 0-5  0 - 5 WBC/hpf Final  . Bacteria, UA 06/03/2019 RARE* NONE SEEN Final  . Squamous Epithelial / LPF 06/03/2019 11-20  0 - 5 Final  . Mucus 06/03/2019 PRESENT   Final   Performed at [facility], [address] [place], [place] [address]  . SARS Coronavirus 2 Ag 06/03/2019 Negative  Negative Final  . Specimen Description 06/03/2019    Final                   Value:URINE, RANDOM Performed at [facility], [address], [place], [place] [address]   . Special Requests 06/03/2019    Final                   Value:NONE Performed at [facility], [address] [place] [place], [place] [address]   . Culture 06/03/2019 MULTIPLE SPECIES PRESENT, SUGGEST RECOLLECTION*  Final  . Report Status 06/03/2019 06/03/2019 FINAL   Final    Assessment: 39 y.o. s/p TLH, BS, cystoscopy stable  Plan: Patient has done well after surgery with no apparent complications.  I have discussed the post-operative course to date, and the expected progress moving forward.  The patient understands what complications to be concerned about.  I will see the patient in routine follow up, or sooner if needed.    Activity plan: no intercourse, no lifting  Granulation tissue - will monitor and re-examine next week.  Given still early in postoperative course anticipate will continue to heal.  The patient is a smoker which may lead to slower healing.  We discussed that if the vaginal mucosal surfaces fail to fuse appropriately this may require revision of the cuff down the road.  Return in about 1 week (around 06/24/2019) for postop.    Vena Austria, MD, [facility], [facility] 06/17/2019, 2:38 PM

## 2019-06-25 ENCOUNTER — Ambulatory Visit (INDEPENDENT_AMBULATORY_CARE_PROVIDER_SITE_OTHER): Payer: PRIVATE HEALTH INSURANCE | Admitting: Obstetrics and Gynecology

## 2019-06-25 ENCOUNTER — Encounter: Payer: Self-pay | Admitting: Obstetrics and Gynecology

## 2019-06-25 ENCOUNTER — Other Ambulatory Visit: Payer: Self-pay

## 2019-06-25 VITALS — BP 116/74 | Wt 127.0 lb

## 2019-06-25 DIAGNOSIS — Z4889 Encounter for other specified surgical aftercare: Secondary | ICD-10-CM

## 2019-06-25 NOTE — Progress Notes (Signed)
Postoperative Follow-up Patient presents post op from [place], BS, cystoscopy 4weeks ago for abnormal uterine bleeding.  Subjective: Patient reports some improvement in her preop symptoms. Eating a regular diet without difficulty. The patient is not having any pain.  Activity: normal activities of daily living.  Patient has some vaginal bleeding last week, this has continued to subside.  She has not other ill effects.    Objective: Blood pressure 116/74, weight 127 lb (57.6 kg), last menstrual period 05/30/2019.  Admission on 06/02/2019, Discharged on 06/03/2019  Component Date Value Ref Range Status  . Lactic Acid, Venous 06/02/2019 1.4  0.5 - 1.9 mmol/L Final   Performed at [facility], [address] [place], [place] [address]  . Sodium 06/02/2019 136  135 - 145 mmol/L Final  . Potassium 06/02/2019 3.1* 3.5 - 5.1 mmol/L Final  . Chloride 06/02/2019 103  98 - 111 mmol/L Final  . CO2 06/02/2019 24  22 - 32 mmol/L Final  . Glucose, Bld 06/02/2019 113* 70 - 99 mg/dL Final  . BUN 06/02/2019 8  6 - 20 mg/dL Final  . Creatinine, Ser 06/02/2019 0.55  0.44 - 1.00 mg/dL Final  . Calcium 06/02/2019 9.2  8.9 - 10.3 mg/dL Final  . Total Protein 06/02/2019 7.4  6.5 - 8.1 g/dL Final  . Albumin 06/02/2019 4.1  3.5 - 5.0 g/dL Final  . AST 06/02/2019 29  15 - 41 U/L Final  . ALT 06/02/2019 48* 0 - 44 U/L Final  . Alkaline Phosphatase 06/02/2019 48  38 - 126 U/L Final  . Total Bilirubin 06/02/2019 0.9  0.3 - 1.2 mg/dL Final  . GFR calc non Af Amer 06/02/2019 >60  >60 mL/min Final  . GFR calc Af Amer 06/02/2019 >60  >60 mL/min Final  . Anion gap 06/02/2019 9  5 - 15 Final   Performed at [facility], [address], [place], [place] [address]  . WBC 06/02/2019 13.2* 4.0 - 10.5 K/uL Final  . RBC 06/02/2019 4.12  3.87 - 5.11 MIL/uL Final  . Hemoglobin 06/02/2019 11.9* 12.0 - 15.0 g/dL Final  . HCT 06/02/2019 36.0  36.0 - 46.0 % Final  . MCV 06/02/2019 87.4  80.0 - 100.0 fL  Final  . MCH 06/02/2019 28.9  26.0 - 34.0 pg Final  . MCHC 06/02/2019 33.1  30.0 - 36.0 g/dL Final  . RDW 06/02/2019 13.5  11.5 - 15.5 % Final  . Platelets 06/02/2019 204  150 - 400 K/uL Final  . nRBC 06/02/2019 0.0  0.0 - 0.2 % Final  . Neutrophils Relative % 06/02/2019 92  % Final  . Neutro Abs 06/02/2019 12.0* 1.7 - 7.7 K/uL Final  . Lymphocytes Relative 06/02/2019 4  % Final  . Lymphs Abs 06/02/2019 0.5* 0.7 - 4.0 K/uL Final  . Monocytes Relative 06/02/2019 4  % Final  . Monocytes Absolute 06/02/2019 0.6  0.1 - 1.0 K/uL Final  . Eosinophils Relative 06/02/2019 0  % Final  . Eosinophils Absolute 06/02/2019 0.0  0.0 - 0.5 K/uL Final  . Basophils Relative 06/02/2019 0  % Final  . Basophils Absolute 06/02/2019 0.1  0.0 - 0.1 K/uL Final  . Immature Granulocytes 06/02/2019 0  % Final  . Abs Immature Granulocytes 06/02/2019 0.05  0.00 - 0.07 K/uL Final   Performed at [facility], [address], [place], [place] [address]  . Color, Urine 06/03/2019 YELLOW* YELLOW Final  . APPearance 06/03/2019 CLEAR* CLEAR Final  . Specific  Gravity, Urine 06/03/2019 1.044* 1.005 - 1.030 Final  . pH 06/03/2019 7.0  5.0 - 8.0 Final  . Glucose, UA 06/03/2019 NEGATIVE  NEGATIVE mg/dL Final  . Hgb urine dipstick 06/03/2019 MODERATE* NEGATIVE Final  . Bilirubin Urine 06/03/2019 NEGATIVE  NEGATIVE Final  . Ketones, ur 06/03/2019 5* NEGATIVE mg/dL Final  . Protein, ur 75/64/3329 NEGATIVE  NEGATIVE mg/dL Final  . Nitrite 51/88/4166 NEGATIVE  NEGATIVE Final  . Glori Luis 06/03/2019 NEGATIVE  NEGATIVE Final  . RBC / HPF 06/03/2019 21-50  0 - 5 RBC/hpf Final  . WBC, UA 06/03/2019 0-5  0 - 5 WBC/hpf Final  . Bacteria, UA 06/03/2019 RARE* NONE SEEN Final  . Squamous Epithelial / LPF 06/03/2019 11-20  0 - 5 Final  . Mucus 06/03/2019 PRESENT   Final   Performed at [facility], [address], [place], [place] [address]  . SARS Coronavirus 2 Ag 06/03/2019 Negative  Negative Final  . Specimen  Description 06/03/2019    Final                   Value:URINE, RANDOM Performed at [facility], [address], [place], [place] [address]   . Special Requests 06/03/2019    Final                   Value:NONE Performed at [facility], [address] [place] [place], [place] [address]   . Culture 06/03/2019 MULTIPLE SPECIES PRESENT, SUGGEST RECOLLECTION*  Final  . Report Status 06/03/2019 06/03/2019 FINAL   Final    Assessment: 39 y.o. s/p TLH, BS, cystoscopy stable  Plan: Patient has done well after surgery with no apparent complications.  I have discussed the post-operative course to date, and the expected progress moving forward.  The patient understands what complications to be concerned about.  I will see the patient in routine follow up, or sooner if needed.    Activity plan: No heavy lifting, no intercourse, no baths  Will evaluate at 6 week mark, discussed if significant granulation tissue may need cuff revision vs additional 2 week healing with vaginal estrogen   Susan Austria, MD, [facility], [facility] 06/25/2019, 1:30 PM

## 2019-07-04 ENCOUNTER — Encounter: Payer: Self-pay | Admitting: Family Medicine

## 2019-07-12 ENCOUNTER — Encounter: Payer: Self-pay | Admitting: Obstetrics and Gynecology

## 2019-07-12 ENCOUNTER — Other Ambulatory Visit: Payer: Self-pay

## 2019-07-12 ENCOUNTER — Ambulatory Visit (INDEPENDENT_AMBULATORY_CARE_PROVIDER_SITE_OTHER): Payer: PRIVATE HEALTH INSURANCE | Admitting: Obstetrics and Gynecology

## 2019-07-12 VITALS — BP 132/74 | HR 82 | Wt 126.0 lb

## 2019-07-12 DIAGNOSIS — Z4889 Encounter for other specified surgical aftercare: Secondary | ICD-10-CM

## 2019-07-12 NOTE — Progress Notes (Signed)
Postoperative Follow-up Patient presents post op from [place], BS, cystoscopy 6weeks ago for abnormal uterine bleeding.  Subjective: Patient reports marked improvement in her preop symptoms. Eating a regular diet without difficulty. The patient is not having any pain.  Activity: normal activities of daily living.  Objective: Blood pressure 132/74, pulse 82, weight 126 lb (57.2 kg), last menstrual period 05/30/2019.  General: NAD Pulmonary: no increased work of breathing Abdomen: soft, non-tender, non-distended, incision(s) D/C/I GU: normal external female genitalia vaginal cuff intact, well healed.  Some residual suture material still evident.  No granulation tissue. Extremities: no edema Neurologic: normal gait    Admission on 06/02/2019, Discharged on 06/03/2019  Component Date Value Ref Range Status  . Lactic Acid, Venous 06/02/2019 1.4  0.5 - 1.9 mmol/L Final   Performed at [facility], [address] [place], [place] [address]  . Sodium 06/02/2019 136  135 - 145 mmol/L Final  . Potassium 06/02/2019 3.1* 3.5 - 5.1 mmol/L Final  . Chloride 06/02/2019 103  98 - 111 mmol/L Final  . CO2 06/02/2019 24  22 - 32 mmol/L Final  . Glucose, Bld 06/02/2019 113* 70 - 99 mg/dL Final  . BUN 06/02/2019 8  6 - 20 mg/dL Final  . Creatinine, Ser 06/02/2019 0.55  0.44 - 1.00 mg/dL Final  . Calcium 06/02/2019 9.2  8.9 - 10.3 mg/dL Final  . Total Protein 06/02/2019 7.4  6.5 - 8.1 g/dL Final  . Albumin 06/02/2019 4.1  3.5 - 5.0 g/dL Final  . AST 06/02/2019 29  15 - 41 U/L Final  . ALT 06/02/2019 48* 0 - 44 U/L Final  . Alkaline Phosphatase 06/02/2019 48  38 - 126 U/L Final  . Total Bilirubin 06/02/2019 0.9  0.3 - 1.2 mg/dL Final  . GFR calc non Af Amer 06/02/2019 >60  >60 mL/min Final  . GFR calc Af Amer 06/02/2019 >60  >60 mL/min Final  . Anion gap 06/02/2019 9  5 - 15 Final   Performed at [facility], [address] [place], [place] [address]  . WBC 06/02/2019  13.2* 4.0 - 10.5 K/uL Final  . RBC 06/02/2019 4.12  3.87 - 5.11 MIL/uL Final  . Hemoglobin 06/02/2019 11.9* 12.0 - 15.0 g/dL Final  . HCT 06/02/2019 36.0  36.0 - 46.0 % Final  . MCV 06/02/2019 87.4  80.0 - 100.0 fL Final  . MCH 06/02/2019 28.9  26.0 - 34.0 pg Final  . MCHC 06/02/2019 33.1  30.0 - 36.0 g/dL Final  . RDW 06/02/2019 13.5  11.5 - 15.5 % Final  . Platelets 06/02/2019 204  150 - 400 K/uL Final  . nRBC 06/02/2019 0.0  0.0 - 0.2 % Final  . Neutrophils Relative % 06/02/2019 92  % Final  . Neutro Abs 06/02/2019 12.0* 1.7 - 7.7 K/uL Final  . Lymphocytes Relative 06/02/2019 4  % Final  . Lymphs Abs 06/02/2019 0.5* 0.7 - 4.0 K/uL Final  . Monocytes Relative 06/02/2019 4  % Final  . Monocytes Absolute 06/02/2019 0.6  0.1 - 1.0 K/uL Final  . Eosinophils Relative 06/02/2019 0  % Final  . Eosinophils Absolute 06/02/2019 0.0  0.0 - 0.5 K/uL Final  . Basophils Relative 06/02/2019 0  % Final  . Basophils Absolute 06/02/2019 0.1  0.0 - 0.1 K/uL Final  . Immature Granulocytes 06/02/2019 0  % Final  . Abs Immature Granulocytes 06/02/2019 0.05  0.00 - 0.07 K/uL Final   Performed at [facility], [address], [place], [place] [address]  . Color, Urine 06/03/2019 YELLOW* YELLOW Final  . APPearance 06/03/2019 CLEAR* CLEAR Final  . Specific Gravity, Urine 06/03/2019 1.044* 1.005 - 1.030 Final  . pH 06/03/2019 7.0  5.0 - 8.0 Final  . Glucose, UA 06/03/2019 NEGATIVE  NEGATIVE mg/dL Final  . Hgb urine dipstick 06/03/2019 MODERATE* NEGATIVE Final  . Bilirubin Urine 06/03/2019 NEGATIVE  NEGATIVE Final  . Ketones, ur 06/03/2019 5* NEGATIVE mg/dL Final  . Protein, ur 19/14/7829 NEGATIVE  NEGATIVE mg/dL Final  . Nitrite 56/21/3086 NEGATIVE  NEGATIVE Final  . Glori Luis 06/03/2019 NEGATIVE  NEGATIVE Final  . RBC / HPF 06/03/2019 21-50  0 - 5 RBC/hpf Final  . WBC, UA 06/03/2019 0-5  0 - 5 WBC/hpf Final  . Bacteria, UA 06/03/2019 RARE* NONE SEEN Final  . Squamous Epithelial / LPF  06/03/2019 11-20  0 - 5 Final  . Mucus 06/03/2019 PRESENT   Final   Performed at [facility], [address], [place], [place] [address]  . SARS Coronavirus 2 Ag 06/03/2019 Negative  Negative Final  . Specimen Description 06/03/2019    Final                   Value:URINE, RANDOM Performed at [facility], [address] [place], [place] [address]   . Special Requests 06/03/2019    Final                   Value:NONE Performed at [facility], [address] [place] [place], [place] [address]   . Culture 06/03/2019 MULTIPLE SPECIES PRESENT, SUGGEST RECOLLECTION*  Final  . Report Status 06/03/2019 06/03/2019 FINAL   Final    Assessment: 39 y.o. s/p TLH, BS, cystoscopy stable  Plan: Patient has done well after surgery with no apparent complications.  I have discussed the post-operative course to date, and the expected progress moving forward.  The patient understands what complications to be concerned about.  I will see the patient in routine follow up, or sooner if needed.    Activity plan: No restriction.   Vena Austria, MD, [facility], [facility] 07/12/2019, 11:28 AM

## 2019-07-23 ENCOUNTER — Other Ambulatory Visit: Payer: Self-pay | Admitting: Obstetrics and Gynecology

## 2019-07-23 DIAGNOSIS — N760 Acute vaginitis: Secondary | ICD-10-CM

## 2019-07-23 MED ORDER — FLUCONAZOLE 150 MG PO TABS: 150.0000 mg | ORAL_TABLET | ORAL | 0 refills | Status: AC

## 2019-07-23 MED ORDER — METRONIDAZOLE 500 MG PO TABS: 500.0000 mg | ORAL_TABLET | Freq: Two times a day (BID) | ORAL | 0 refills | Status: AC

## 2019-07-23 NOTE — Telephone Encounter (Signed)
FYI

## 2019-08-22 ENCOUNTER — Other Ambulatory Visit: Payer: Self-pay

## 2019-08-22 ENCOUNTER — Encounter: Payer: Self-pay | Admitting: Family Medicine

## 2019-08-22 ENCOUNTER — Ambulatory Visit (INDEPENDENT_AMBULATORY_CARE_PROVIDER_SITE_OTHER): Payer: PRIVATE HEALTH INSURANCE | Admitting: Family Medicine

## 2019-08-22 VITALS — BP 118/66 | HR 66 | Temp 98.9°F | Resp 22 | Ht 66.0 in | Wt 123.5 lb

## 2019-08-22 DIAGNOSIS — S39011A Strain of muscle, fascia and tendon of abdomen, initial encounter: Secondary | ICD-10-CM

## 2019-08-22 NOTE — Patient Instructions (Signed)
#   Abdominal wall muscle fullness - watch and wait - consider some gentle stretching and massage to the area - if pain is worsening or the area is enlarging call back and we can get an ultrasound to evaluate

## 2019-08-22 NOTE — Progress Notes (Signed)
   Subjective:     Susan Wade is a 39 y.o. female presenting for Knot on abdomen/rib area (right rib area/RUQ abd area. Has a pulling sensation with certain movements. No pain.)     HPI   #Mass - no pain - noticed it first 3 weeks ago - cannot feel if laying down or raising the arms - occasionally feels like there is a pulling/tightening sensation or tug feeling - right rib location - no change in size   Review of Systems   Social History   Tobacco Use  Smoking Status Current Every Day Smoker  . Packs/day: 0.50  . Years: 17.00  . Pack years: 8.50  . Types: Cigarettes  Smokeless Tobacco Never Used        Objective:    BP Readings from Last 3 Encounters:  08/22/19 118/66  07/12/19 132/74  06/25/19 116/74   Wt Readings from Last 3 Encounters:  08/22/19 123 lb 8 oz (56 kg)  07/12/19 126 lb (57.2 kg)  06/25/19 127 lb (57.6 kg)    BP 118/66   Pulse 66   Temp 98.9 F (37.2 C)   Resp (!) 22   Ht 5\' 6"  (1.676 m)   Wt 123 lb 8 oz (56 kg)   LMP 05/30/2019   SpO2 100%   BMI 19.93 kg/m    Physical Exam Constitutional:      General: She is not in acute distress.    Appearance: She is well-developed. She is not diaphoretic.  HENT:     Right Ear: External ear normal.     Left Ear: External ear normal.     Nose: Nose normal.  Eyes:     Conjunctiva/sclera: Conjunctivae normal.  Cardiovascular:     Rate and Rhythm: Normal rate.  Pulmonary:     Effort: Pulmonary effort is normal.  Abdominal:     General: Abdomen is flat. Bowel sounds are normal. There is no distension.     Palpations: Abdomen is soft.     Tenderness: There is no abdominal tenderness. There is no guarding.     Comments: Area of concern - RUQ below the rib cage is an area of thickened muscle. No discrete mass.   Musculoskeletal:     Cervical back: Neck supple.  Skin:    General: Skin is warm and dry.     Capillary Refill: Capillary refill takes less than 2 seconds.  Neurological:     Mental Status: She is alert. Mental status is at baseline.  Psychiatric:        Mood and Affect: Mood normal.        Behavior: Behavior normal.           Assessment & Plan:   Problem List Items Addressed This Visit    None    Visit Diagnoses    Strain of abdominal muscle, initial encounter    -  Primary     Suspect some abdominal muscle wall muscle tension. Advised stretching and massage. Could get 07/28/2019 if worsening/not resolving or consider PT if pain is severe.    Return if symptoms worsen or fail to improve.  [place], MD

## 2019-08-30 ENCOUNTER — Encounter: Payer: Self-pay | Admitting: Family Medicine

## 2019-08-30 DIAGNOSIS — S39011A Strain of muscle, fascia and tendon of abdomen, initial encounter: Secondary | ICD-10-CM

## 2019-09-06 ENCOUNTER — Other Ambulatory Visit: Payer: Self-pay | Admitting: Family Medicine

## 2019-09-06 ENCOUNTER — Ambulatory Visit
Admission: RE | Admit: 2019-09-06 | Discharge: 2019-09-06 | Disposition: A | Discharge status: Home / Self Care | Payer: PRIVATE HEALTH INSURANCE | Source: Ambulatory Visit | Attending: Family Medicine | Admitting: Family Medicine

## 2019-09-06 DIAGNOSIS — S39011A Strain of muscle, fascia and tendon of abdomen, initial encounter: Secondary | ICD-10-CM

## 2020-01-14 ENCOUNTER — Encounter: Payer: Self-pay | Admitting: Family Medicine

## 2020-01-16 ENCOUNTER — Encounter: Payer: Self-pay | Admitting: Family Medicine

## 2020-01-16 ENCOUNTER — Ambulatory Visit: Payer: PRIVATE HEALTH INSURANCE | Admitting: Family Medicine

## 2020-02-10 ENCOUNTER — Encounter: Payer: Self-pay | Admitting: Family Medicine

## 2020-02-17 ENCOUNTER — Ambulatory Visit (INDEPENDENT_AMBULATORY_CARE_PROVIDER_SITE_OTHER): Payer: PRIVATE HEALTH INSURANCE | Admitting: Family Medicine

## 2020-02-17 ENCOUNTER — Other Ambulatory Visit: Payer: Self-pay

## 2020-02-17 ENCOUNTER — Encounter: Payer: Self-pay | Admitting: Family Medicine

## 2020-02-17 VITALS — BP 102/80 | HR 76 | Temp 98.6°F | Wt 122.8 lb

## 2020-02-17 DIAGNOSIS — Z1159 Encounter for screening for other viral diseases: Secondary | ICD-10-CM | POA: Diagnosis not present

## 2020-02-17 DIAGNOSIS — Z23 Encounter for immunization: Secondary | ICD-10-CM | POA: Diagnosis not present

## 2020-02-17 DIAGNOSIS — Z114 Encounter for screening for human immunodeficiency virus [HIV]: Secondary | ICD-10-CM | POA: Diagnosis not present

## 2020-02-17 DIAGNOSIS — K219 Gastro-esophageal reflux disease without esophagitis: Secondary | ICD-10-CM

## 2020-02-17 DIAGNOSIS — R42 Dizziness and giddiness: Secondary | ICD-10-CM | POA: Diagnosis not present

## 2020-02-17 DIAGNOSIS — Z13220 Encounter for screening for lipoid disorders: Secondary | ICD-10-CM | POA: Diagnosis not present

## 2020-02-17 DIAGNOSIS — R1013 Epigastric pain: Secondary | ICD-10-CM | POA: Diagnosis not present

## 2020-02-17 LAB — CBC
HCT: 43.4 % (ref 36.0–46.0)
Hemoglobin: 14.6 g/dL (ref 12.0–15.0)
MCHC: 33.7 g/dL (ref 30.0–36.0)
MCV: 88.4 fl (ref 78.0–100.0)
Platelets: 195 10*3/uL (ref 150.0–400.0)
RBC: 4.9 Mil/uL (ref 3.87–5.11)
RDW: 13.4 % (ref 11.5–15.5)
WBC: 9.7 10*3/uL (ref 4.0–10.5)

## 2020-02-17 LAB — COMPREHENSIVE METABOLIC PANEL
ALT: 10 U/L (ref 0–35)
AST: 13 U/L (ref 0–37)
Albumin: 5 g/dL (ref 3.5–5.2)
Alkaline Phosphatase: 56 U/L (ref 39–117)
BUN: 9 mg/dL (ref 6–23)
CO2: 28 mEq/L (ref 19–32)
Calcium: 9.7 mg/dL (ref 8.4–10.5)
Chloride: 104 mEq/L (ref 96–112)
Creatinine, Ser: 0.56 mg/dL (ref 0.40–1.20)
GFR: 116.01 mL/min (ref >60.00)
Glucose, Bld: 73 mg/dL (ref 70–99)
Potassium: 4.1 mEq/L (ref 3.5–5.1)
Sodium: 139 mEq/L (ref 135–145)
Total Bilirubin: 0.7 mg/dL (ref 0.2–1.2)
Total Protein: 7.9 g/dL (ref 6.0–8.3)

## 2020-02-17 LAB — LIPID PANEL
Cholesterol: 184 mg/dL (ref 0–200)
HDL: 64.2 mg/dL (ref >39.00)
LDL Cholesterol: 103 mg/dL — ABNORMAL HIGH (ref 0–99)
NonHDL: 119.46
Total CHOL/HDL Ratio: 3
Triglycerides: 84 mg/dL (ref 0.0–149.0)
VLDL: 16.8 mg/dL (ref 0.0–40.0)

## 2020-02-17 LAB — LIPASE: Lipase: 16 U/L (ref 11.0–59.0)

## 2020-02-17 MED ORDER — OMEPRAZOLE 40 MG PO CPDR: 40.0000 mg | DELAYED_RELEASE_CAPSULE | Freq: Every day | ORAL | 3 refills | Status: DC

## 2020-02-17 NOTE — Patient Instructions (Signed)
Dizziness - slow transitions - could consider compression socks for long days - water and salt - try to make sure you are eating at least 2 meals per day  Stomach issues - increase omeprazole 40 mg daily - keep me posted - blood work today

## 2020-02-17 NOTE — Assessment & Plan Note (Signed)
Suspect some postural dizziness in setting of poor PO intake due to abdominal pain and low BP. Advised slow transitions, increased PO intake and compression socks. Discussed option of medication if worsening or fainting.

## 2020-02-17 NOTE — Assessment & Plan Note (Signed)
Suspect GERD but labs to evaluate other caues

## 2020-02-17 NOTE — Progress Notes (Signed)
Subjective:     Susan Wade is a 39 y.o. female presenting for Abdominal Pain (Upper center pain "constantly" w/ nausea. prilosec no longer helps ) and Dizziness (mostly in morning, then subsides )     Abdominal Pain This is a new problem. The current episode started 1 to 4 weeks ago. The onset quality is sudden. The problem occurs constantly. The pain is located in the epigastric region. The quality of the pain is dull and sharp (nawing pain). Associated symptoms include anorexia and constipation (chronic). Pertinent negatives include no diarrhea. Associated symptoms comments: Dizziness. Nothing aggravates the pain. The pain is relieved by eating and liquids. She has tried proton pump inhibitors for the symptoms. The treatment provided mild relief. Prior diagnostic workup includes GI consult, upper endoscopy and lower endoscopy.  Dizziness Associated symptoms include abdominal pain and anorexia.    Colonoscopy and endo done in Jan    Endo with gastritis but did course of famotidine w/ improvement  Symptoms started in early in September    Would lay with heating pad and was getting back pain when it first started   #Dizziness - in the morning - feels spaced out - sometimes happen in the evening - shaky sensation - worse with bending over and getting out of the bed - sometimes just comes - water - all day long - does consume salt  Review of Systems  Gastrointestinal: Positive for abdominal pain, anorexia and constipation (chronic). Negative for diarrhea.  Neurological: Positive for dizziness.     Social History   Tobacco Use  Smoking Status Current Every Day Smoker  . Packs/day: 0.50  . Years: 17.00  . Pack years: 8.50  . Types: Cigarettes  Smokeless Tobacco Never Used        Objective:    BP Readings from Last 3 Encounters:  02/17/20 102/80  08/22/19 118/66  07/12/19 132/74   Wt Readings from Last 3 Encounters:  02/17/20 122 lb 12 oz (55.7 kg)   08/22/19 123 lb 8 oz (56 kg)  07/12/19 126 lb (57.2 kg)    BP 102/80   Pulse 76   Temp 98.6 F (37 C) (Temporal)   Wt 122 lb 12 oz (55.7 kg)   LMP 05/30/2019   SpO2 99%   BMI 19.81 kg/m    Physical Exam Constitutional:      General: She is not in acute distress.    Appearance: She is well-developed. She is not diaphoretic.  HENT:     Head: Normocephalic and atraumatic.     Right Ear: External ear normal.     Left Ear: External ear normal.     Nose: Nose normal.  Eyes:     Conjunctiva/sclera: Conjunctivae normal.  Cardiovascular:     Rate and Rhythm: Normal rate and regular rhythm.     Heart sounds: No murmur heard.   Pulmonary:     Effort: Pulmonary effort is normal. No respiratory distress.     Breath sounds: Normal breath sounds. No wheezing.  Abdominal:     General: Abdomen is flat. Bowel sounds are normal. There is no distension.     Palpations: Abdomen is soft. There is no hepatomegaly or splenomegaly.     Tenderness: There is no abdominal tenderness. There is no guarding or rebound. Negative signs include Murphy's sign.  Musculoskeletal:     Cervical back: Neck supple.  Skin:    General: Skin is warm and dry.     Capillary Refill: Capillary refill takes  less than 2 seconds.  Neurological:     Mental Status: She is alert. Mental status is at baseline.  Psychiatric:        Mood and Affect: Mood normal.        Behavior: Behavior normal.           Assessment & Plan:   Problem List Items Addressed This Visit      Digestive   Gastroesophageal reflux disease - Primary    Reports prior EGD 04/2019 with gastritis. Though symptoms persisting on 20 mg omeprazole. Will get labs to evaluate for others causes and h pylori testing. Increase omeprazole to 40 mg daily. If work-up normal and symptoms not improved - return to GI.       Relevant Medications   omeprazole (PRILOSEC) 40 MG capsule   Other Relevant Orders   H. pylori antibody, IgG     Other    Dizziness    Suspect some postural dizziness in setting of poor PO intake due to abdominal pain and low BP. Advised slow transitions, increased PO intake and compression socks. Discussed option of medication if worsening or fainting.       Relevant Orders   CBC   Epigastric abdominal pain    Suspect GERD but labs to evaluate other caues      Relevant Orders   Comprehensive metabolic panel   Lipase    Other Visit Diagnoses    Need for influenza vaccination       Relevant Orders   Flu Vaccine QUAD 36+ mos IM (Completed)   Need for hepatitis C screening test       Relevant Orders   Hepatitis C antibody   Encounter for screening for HIV       Relevant Orders   HIV Antibody (routine testing w rflx)   Screening for hyperlipidemia       Relevant Orders   Lipid panel       Return if symptoms worsen or fail to improve.  Lynnda Child, MD  This visit occurred during the SARS-CoV-2 public health emergency.  Safety protocols were in place, including screening questions prior to the visit, additional usage of staff PPE, and extensive cleaning of exam room while observing appropriate contact time as indicated for disinfecting solutions.

## 2020-02-17 NOTE — Assessment & Plan Note (Signed)
Reports prior EGD 04/2019 with gastritis. Though symptoms persisting on 20 mg omeprazole. Will get labs to evaluate for others causes and h pylori testing. Increase omeprazole to 40 mg daily. If work-up normal and symptoms not improved - return to GI.

## 2020-02-18 LAB — HEPATITIS C ANTIBODY
Hepatitis C Ab: NONREACTIVE
SIGNAL TO CUT-OFF: 0.01 (ref <1.00)

## 2020-02-18 LAB — HIV ANTIBODY (ROUTINE TESTING W REFLEX): HIV 1&2 Ab, 4th Generation: NONREACTIVE

## 2020-02-18 LAB — H. PYLORI ANTIBODY, IGG: H Pylori IgG: NEGATIVE

## 2020-05-26 ENCOUNTER — Encounter: Payer: Self-pay | Admitting: Family Medicine

## 2020-05-26 NOTE — Telephone Encounter (Signed)
Called and discussed with patient. I was unable to locate the records request. She states they need all visits that she has had with Dr. Selena Batten. Due to the inconvenience and since there are only 2 visits, I went ahead and sent them over to the insurance company. I got verbal authorization from the patient to send these records over. I did apologize for the inconvenience and she told me she understands.

## 2020-06-09 ENCOUNTER — Other Ambulatory Visit: Payer: Self-pay

## 2020-06-09 ENCOUNTER — Ambulatory Visit (INDEPENDENT_AMBULATORY_CARE_PROVIDER_SITE_OTHER): Payer: PRIVATE HEALTH INSURANCE | Admitting: Family Medicine

## 2020-06-09 VITALS — BP 120/78 | HR 72 | Temp 98.3°F | Ht 66.0 in | Wt 129.5 lb

## 2020-06-09 DIAGNOSIS — R002 Palpitations: Secondary | ICD-10-CM

## 2020-06-09 DIAGNOSIS — R5383 Other fatigue: Secondary | ICD-10-CM

## 2020-06-09 DIAGNOSIS — F419 Anxiety disorder, unspecified: Secondary | ICD-10-CM

## 2020-06-09 DIAGNOSIS — F411 Generalized anxiety disorder: Secondary | ICD-10-CM | POA: Insufficient documentation

## 2020-06-09 MED ORDER — HYDROXYZINE HCL 50 MG PO TABS: 25.0000 mg | ORAL_TABLET | Freq: Three times a day (TID) | ORAL | 1 refills | Status: DC | PRN

## 2020-06-09 NOTE — Progress Notes (Signed)
Subjective:     Susan Wade is a 40 y.o. female presenting for Anxiety (X 2 weeks ), Fatigue (X 2 weeks ), and Nausea (X 2 weeks )     HPI  #anxiety - long hx of social anxiety - not wanting to be around people, anxiety about going to events - this has gotten worse over the last 2 weeks - no home changes (in the process of buying a house - this is coming together well) - in the morning felt sick, nervousness, coughing and nausea  - taking dramamine to help with the nausea - symptoms anxiety, nausea, lack of interest - stay at home - low motivation to do things - cooking - would rather sleep  - has noticed some palpitations   Insomnia - trouble falling asleep - goes to bed 9:30 and will wake up at 1-2 am - and falls back asleep - tried zzquill   No new stressors  Family hx anxiety and depression No personal hx of ever needing medications    Review of Systems   Social History   Tobacco Use  Smoking Status Current Every Day Smoker  . Packs/day: 0.50  . Years: 17.00  . Pack years: 8.50  . Types: Cigarettes  Smokeless Tobacco Never Used        Objective:    BP Readings from Last 3 Encounters:  06/09/20 120/78  02/17/20 102/80  08/22/19 118/66   Wt Readings from Last 3 Encounters:  06/09/20 129 lb 8 oz (58.7 kg)  02/17/20 122 lb 12 oz (55.7 kg)  08/22/19 123 lb 8 oz (56 kg)    BP 120/78   Pulse 72   Temp 98.3 F (36.8 C) (Temporal)   Ht 5\' 6"  (1.676 m)   Wt 129 lb 8 oz (58.7 kg)   LMP 05/30/2019   SpO2 100%   BMI 20.90 kg/m    Physical Exam Constitutional:      General: She is not in acute distress.    Appearance: She is well-developed. She is not diaphoretic.  HENT:     Right Ear: External ear normal.     Left Ear: External ear normal.     Nose: Nose normal.  Eyes:     Conjunctiva/sclera: Conjunctivae normal.  Neck:     Thyroid: No thyroid mass, thyromegaly or thyroid tenderness.  Cardiovascular:     Rate and Rhythm: Normal rate.   Pulmonary:     Effort: Pulmonary effort is normal.  Musculoskeletal:     Cervical back: Neck supple.  Lymphadenopathy:     Cervical: No cervical adenopathy.  Skin:    General: Skin is warm and dry.     Capillary Refill: Capillary refill takes less than 2 seconds.  Neurological:     Mental Status: She is alert. Mental status is at baseline.  Psychiatric:        Mood and Affect: Mood normal.        Behavior: Behavior normal.     Depression screen [facility] 2/9 06/09/2020 08/22/2019  Decreased Interest 2 0  Down, Depressed, Hopeless 2 0  PHQ - 2 Score 4 0  Altered sleeping 3 -  Tired, decreased energy 3 -  Change in appetite 3 -  Feeling bad or failure about yourself  0 -  Trouble concentrating 2 -  Moving slowly or fidgety/restless 2 -  Suicidal thoughts 0 -  PHQ-9 Score 17 -  Difficult doing work/chores Somewhat difficult -   GAD 7 : Generalized  Anxiety Score 06/09/2020  Nervous, Anxious, on Edge 3  Control/stop worrying 2  Worry too much - different things 0  Trouble relaxing 3  Restless 2  Easily annoyed or irritable 2  Afraid - awful might happen 0  Total GAD 7 Score 12  Anxiety Difficulty Somewhat difficult          Assessment & Plan:   Problem List Items Addressed This Visit      Other   Anxiety - Primary    2 weeks of symptoms. Discussed r/o thyroid and vitamin D given sudden onset and palpitations/fatigue. Advised meditation, limit caffeine, consider therapy. Trial of hydroxyzine for sleep and daytime if tolerated. Consider emergency benzo if no response. Will strongly advise therapy if not improving over the next 3-4 weeks.       Relevant Medications   hydrOXYzine (ATARAX/VISTARIL) 50 MG tablet   Other Relevant Orders   TSH    Other Visit Diagnoses    Palpitations       Relevant Orders   TSH   Other fatigue       Relevant Orders   Vitamin D, 25-hydroxy       Return in about 4 weeks (around 07/07/2020) for anxiety.  Lynnda Child, MD  This  visit occurred during the SARS-CoV-2 public health emergency.  Safety protocols were in place, including screening questions prior to the visit, additional usage of staff PPE, and extensive cleaning of exam room while observing appropriate contact time as indicated for disinfecting solutions.

## 2020-06-09 NOTE — Patient Instructions (Addendum)
Hydroxyzine as needed for anxiety - or sleep - may make you sleeping  Reach out if no improvement Can consider short course of something like xanax for severe symptoms    How to help anxiety - without medication.   1) Regular Exercise - walking, jogging, cycling, dancing, strength training --> Yoga has been shown in research to reduce depression and anxiety -- with even just one hour long session per week  2)  Begin a Mindfulness/Meditation practice -- this can take a little as 3 minutes and is helpful for all kinds of mood issues -- You can find resources in books -- Or you can download apps like  ---- Headspace App (which currently has free content called "Weathering the Storm") ---- Calm (which has a few free options)  ---- Insignt Timer ---- Stop, Breathe & Think  # With each of these Apps - you should decline the "start free trial" offer and as you search through the App should be able to access some of their free content. You can also chose to pay for the content if you find one that works well for you.   # Many of them also offer sleep specific content which may help with insomnia  3) Healthy Diet -- Avoid or decrease Caffeine -- Avoid or decrease Alcohol -- Drink plenty of water, have a balanced diet -- Avoid cigarettes and marijuana (as well as other recreational drugs)  4) Consider contacting a professional therapist  -- [facility] is one option. Call [phone] -- Or you can check out www.psychologytoday.com -- you can read bios of therapists and see if they accept insurance -- Check with your insurance to see if you have coverage and who may take your insurance

## 2020-06-09 NOTE — Assessment & Plan Note (Signed)
2 weeks of symptoms. Discussed r/o thyroid and vitamin D given sudden onset and palpitations/fatigue. Advised meditation, limit caffeine, consider therapy. Trial of hydroxyzine for sleep and daytime if tolerated. Consider emergency benzo if no response. Will strongly advise therapy if not improving over the next 3-4 weeks.

## 2020-06-10 ENCOUNTER — Encounter: Payer: Self-pay | Admitting: Family Medicine

## 2020-06-10 DIAGNOSIS — F419 Anxiety disorder, unspecified: Secondary | ICD-10-CM

## 2020-06-10 LAB — TSH: TSH: 1.36 u[IU]/mL (ref 0.35–4.50)

## 2020-06-10 LAB — VITAMIN D 25 HYDROXY (VIT D DEFICIENCY, FRACTURES): VITD: 41.42 ng/mL (ref 30.00–100.00)

## 2020-06-10 MED ORDER — ALPRAZOLAM 0.25 MG PO TABS: 0.2500 mg | ORAL_TABLET | Freq: Two times a day (BID) | ORAL | 0 refills | Status: DC | PRN

## 2020-06-29 MED ORDER — SERTRALINE HCL 50 MG PO TABS: ORAL_TABLET | ORAL | 3 refills | Status: DC

## 2020-06-29 MED ORDER — ALPRAZOLAM 0.25 MG PO TABS: 0.2500 mg | ORAL_TABLET | Freq: Two times a day (BID) | ORAL | 0 refills | Status: DC | PRN

## 2020-06-29 NOTE — Addendum Note (Signed)
Addended by: Lynnda Child on: 06/29/2020 04:28 PM   Modules accepted: Orders

## 2020-07-20 ENCOUNTER — Encounter: Payer: Self-pay | Admitting: Family Medicine

## 2020-08-26 ENCOUNTER — Encounter: Payer: Self-pay | Admitting: Family Medicine

## 2020-08-26 ENCOUNTER — Other Ambulatory Visit: Payer: Self-pay | Admitting: Family Medicine

## 2020-08-26 DIAGNOSIS — F419 Anxiety disorder, unspecified: Secondary | ICD-10-CM

## 2020-08-26 NOTE — Telephone Encounter (Signed)
Pharmacy requests refill on: Alprazolam 0.25 mg   LAST REFILL: 06/29/2020 (Q-20, R-0) LAST OV: 06/09/2020 NEXT OV: Not Scheduled  PHARMACY: [id number] [place], [place]

## 2020-09-11 ENCOUNTER — Encounter: Payer: Self-pay | Admitting: Family Medicine

## 2020-09-11 DIAGNOSIS — F419 Anxiety disorder, unspecified: Secondary | ICD-10-CM

## 2020-09-11 MED ORDER — FLUOXETINE HCL 10 MG PO CAPS: ORAL_CAPSULE | ORAL | 0 refills | Status: DC

## 2020-09-11 MED ORDER — ALPRAZOLAM 0.25 MG PO TABS: 0.2500 mg | ORAL_TABLET | Freq: Two times a day (BID) | ORAL | 0 refills | Status: DC | PRN

## 2020-09-30 ENCOUNTER — Encounter: Payer: Self-pay | Admitting: Family Medicine

## 2020-09-30 ENCOUNTER — Other Ambulatory Visit: Payer: Self-pay | Admitting: Family Medicine

## 2020-09-30 DIAGNOSIS — F419 Anxiety disorder, unspecified: Secondary | ICD-10-CM

## 2020-10-01 NOTE — Telephone Encounter (Signed)
Last office visit 06/09/2020 for Anxiety, Fatigue and Nausea.  Last refilled 09/11/2020 for #20 with no refills.  Next Appt: 11/05/2020 for follow up meds.

## 2020-10-13 ENCOUNTER — Other Ambulatory Visit: Payer: Self-pay | Admitting: Family Medicine

## 2020-10-13 DIAGNOSIS — F419 Anxiety disorder, unspecified: Secondary | ICD-10-CM

## 2020-11-01 ENCOUNTER — Other Ambulatory Visit: Payer: Self-pay | Admitting: Family Medicine

## 2020-11-01 DIAGNOSIS — F419 Anxiety disorder, unspecified: Secondary | ICD-10-CM

## 2020-11-02 NOTE — Telephone Encounter (Signed)
Last office visit 06/09/2020 for Anxiety.  Last refilled 10/01/2020 for #20 with no refills.  Next App: 11/05/2020 for 4 week follow up on new meds.

## 2020-11-05 ENCOUNTER — Ambulatory Visit: Payer: Self-pay | Admitting: Family Medicine

## 2020-11-22 ENCOUNTER — Other Ambulatory Visit: Payer: Self-pay | Admitting: Family Medicine

## 2020-11-22 DIAGNOSIS — F419 Anxiety disorder, unspecified: Secondary | ICD-10-CM

## 2020-11-25 NOTE — Telephone Encounter (Signed)
Last Fill or Written Date and Quantity: 11/02/20 #20 w/ 0 Last Office Visit and Type: 06/09/20. Cancelled 11/05/20 appt Next Office Visit and Type: none scheduled

## 2020-11-30 ENCOUNTER — Telehealth: Payer: Self-pay

## 2020-11-30 ENCOUNTER — Ambulatory Visit: Payer: Self-pay | Admitting: Family Medicine

## 2020-11-30 ENCOUNTER — Encounter: Payer: Self-pay | Admitting: Family Medicine

## 2020-11-30 NOTE — Telephone Encounter (Signed)
[facility] Night - Client Nonclinical Telephone Record AccessNurse Client [facility] Night - Client Client Site [facility] - Night Physician Gweneth Dimitri- MD Contact Type Call Who Is Calling Patient / Member / Family / Caregiver Caller Name Clarene Reamer' Luvenia Heller Phone Number [phone] Patient Name Susan Wade' [name] Patient DOB Feb 26, 1981 Call Type Message Only Information Provided Reason for Call Request to [facility] Appointment Initial Comment Caller states his wife has an appointment due to being expose to COVID. Patient request to speak to RN No Additional Comment Provided office hours. Disp. Time Disposition Final User 11/30/2020 7:25:09 AM General Information Provided Yes Kerrin Mo Olean Ree Call Closed By: Maury Dus Transaction Date/Time: 11/30/2020 7:21:55 AM (ET)

## 2020-11-30 NOTE — Telephone Encounter (Signed)
Unable to reach pts husband to get more info on pts symptoms presently. Sending note to Dr Selena Batten and [facility] CMA.per appt notes appears appt has been cancelled  and appt rescheduled.

## 2020-11-30 NOTE — Telephone Encounter (Signed)
Ok will plan to see next week unless she develops symptoms

## 2020-11-30 NOTE — Telephone Encounter (Signed)
Routing to front to help answer questions

## 2020-12-02 ENCOUNTER — Telehealth (INDEPENDENT_AMBULATORY_CARE_PROVIDER_SITE_OTHER): Payer: Self-pay | Admitting: Family Medicine

## 2020-12-02 ENCOUNTER — Encounter: Payer: Self-pay | Admitting: Family Medicine

## 2020-12-02 DIAGNOSIS — F320 Major depressive disorder, single episode, mild: Secondary | ICD-10-CM

## 2020-12-02 DIAGNOSIS — F419 Anxiety disorder, unspecified: Secondary | ICD-10-CM

## 2020-12-02 DIAGNOSIS — F329 Major depressive disorder, single episode, unspecified: Secondary | ICD-10-CM | POA: Insufficient documentation

## 2020-12-02 DIAGNOSIS — F411 Generalized anxiety disorder: Secondary | ICD-10-CM

## 2020-12-02 MED ORDER — VENLAFAXINE HCL ER 37.5 MG PO CP24: ORAL_CAPSULE | ORAL | 0 refills | Status: DC

## 2020-12-02 MED ORDER — ALPRAZOLAM 0.5 MG PO TABS: 0.2500 mg | ORAL_TABLET | Freq: Two times a day (BID) | ORAL | 0 refills | Status: DC | PRN

## 2020-12-02 NOTE — Assessment & Plan Note (Signed)
Worsening. Start effexor. Continue xanax as below

## 2020-12-02 NOTE — Progress Notes (Signed)
I connected with Susan Wade on 12/02/20 at  8:20 AM EDT by video and verified that I am speaking with the correct person using two identifiers.   I discussed the limitations, risks, security and privacy concerns of performing an evaluation and management service by video and the availability of in person appointments. I also discussed with the patient that there may be a patient responsible charge related to this service. The patient expressed understanding and agreed to proceed.  Patient location: Home Provider Location: [facility] Participants: Lesleigh Noe and Avi S Rund   Subjective:     Susan Wade is a 40 y.o. female presenting for Follow-up (Anxiety/depression)     HPI  #Anxiety - Initially things were going good on prozac - after 1 week now have nausea, no appetite - added stress - grandmother fell and broke her hip, mom recently diagnosed with breast cancer -  not sleeping - worrying about everything - mom has stage 2 BrCa  - irritable  - energy levels are low generally - feeling down, worry so much she doesn't feel like doing anything - xanax helps but feels I is not as effective as it once was - also tried zoloft - side effects - jaw clenching   Review of Systems   Social History   Tobacco Use  Smoking Status Every Day   Packs/day: 0.50   Years: 17.00   Pack years: 8.50   Types: Cigarettes  Smokeless Tobacco Never        Objective:   BP Readings from Last 3 Encounters:  06/09/20 120/78  02/17/20 102/80  08/22/19 118/66   Wt Readings from Last 3 Encounters:  12/02/20 128 lb (58.1 kg)  06/09/20 129 lb 8 oz (58.7 kg)  02/17/20 122 lb 12 oz (55.7 kg)    Wt 128 lb (58.1 kg)   LMP 05/30/2019   BMI 20.66 kg/m   Physical Exam Constitutional:      Appearance: Normal appearance. She is not ill-appearing.  HENT:     Head: Normocephalic and atraumatic.     Right Ear: External ear normal.     Left Ear: External ear  normal.  Eyes:     Conjunctiva/sclera: Conjunctivae normal.  Pulmonary:     Effort: Pulmonary effort is normal. No respiratory distress.  Neurological:     Mental Status: She is alert. Mental status is at baseline.  Psychiatric:        Mood and Affect: Mood normal.        Behavior: Behavior normal.        Thought Content: Thought content normal.        Judgment: Judgment normal.   GAD 7 : Generalized Anxiety Score 12/02/2020 06/09/2020  Nervous, Anxious, on Edge 3 3  Control/stop worrying 3 2  Worry too much - different things 3 0  Trouble relaxing 3 3  Restless 3 2  Easily annoyed or irritable 3 2  Afraid - awful might happen 2 0  Total GAD 7 Score 20 12  Anxiety Difficulty Somewhat difficult Somewhat difficult    Depression screen [facility] 2/9 12/02/2020 06/09/2020 08/22/2019  Decreased Interest 1 2 0  Down, Depressed, Hopeless 1 2 0  PHQ - 2 Score 2 4 0  Altered sleeping 3 3 -  Tired, decreased energy 3 3 -  Change in appetite 2 3 -  Feeling bad or failure about yourself  0 0 -  Trouble concentrating 3 2 -  Moving  slowly or fidgety/restless 3 2 -  Suicidal thoughts 0 0 -  PHQ-9 Score 16 17 -  Difficult doing work/chores Somewhat difficult Somewhat difficult -           Assessment & Plan:   Problem List Items Addressed This Visit       Other   Generalized anxiety disorder    Discussed need for daily medication. Will try effexor. Start with 37.5 mg and increase only if side effects improving. Also discussed slight worsening of anxiety while starting and OK for twice daily xanax if needed, but to taper as tolerated. Goal would be to reduce use to a few times a week once stable medication but given current stressors will do temporary adjunct for support. Return 6 weeks in-person      Relevant Medications   ALPRAZolam (XANAX) 0.5 MG tablet   venlafaxine XR (EFFEXOR XR) 37.5 MG 24 hr capsule   Major depressive disorder with current active episode    Worsening. Start  effexor. Continue xanax as below      Relevant Medications   ALPRAZolam (XANAX) 0.5 MG tablet   venlafaxine XR (EFFEXOR XR) 37.5 MG 24 hr capsule     Return in about 6 weeks (around 01/13/2021) for anxiety.  Lesleigh Noe, MD

## 2020-12-02 NOTE — Assessment & Plan Note (Signed)
Discussed need for daily medication. Will try effexor. Start with 37.5 mg and increase only if side effects improving. Also discussed slight worsening of anxiety while starting and OK for twice daily xanax if needed, but to taper as tolerated. Goal would be to reduce use to a few times a week once stable medication but given current stressors will do temporary adjunct for support. Return 6 weeks in-person

## 2020-12-04 ENCOUNTER — Encounter: Payer: Self-pay | Admitting: Family Medicine

## 2020-12-07 ENCOUNTER — Ambulatory Visit: Payer: Self-pay | Admitting: Family Medicine

## 2021-01-08 ENCOUNTER — Other Ambulatory Visit: Payer: Self-pay | Admitting: Family Medicine

## 2021-01-08 ENCOUNTER — Encounter: Payer: Self-pay | Admitting: Family Medicine

## 2021-01-08 DIAGNOSIS — F419 Anxiety disorder, unspecified: Secondary | ICD-10-CM

## 2021-01-11 MED ORDER — ALPRAZOLAM 0.5 MG PO TABS: 0.2500 mg | ORAL_TABLET | Freq: Two times a day (BID) | ORAL | 0 refills | Status: DC | PRN

## 2021-01-13 MED ORDER — VENLAFAXINE HCL ER 150 MG PO CP24: 150.0000 mg | ORAL_CAPSULE | Freq: Every day | ORAL | 1 refills | Status: DC

## 2021-01-13 NOTE — Telephone Encounter (Signed)
Spoke with patient.  Increased dose sent to pharmacy

## 2021-01-29 ENCOUNTER — Encounter: Payer: Self-pay | Admitting: Family Medicine

## 2021-01-29 DIAGNOSIS — F419 Anxiety disorder, unspecified: Secondary | ICD-10-CM

## 2021-02-01 MED ORDER — ALPRAZOLAM 0.5 MG PO TABS: 0.2500 mg | ORAL_TABLET | Freq: Two times a day (BID) | ORAL | 0 refills | Status: DC | PRN

## 2021-02-03 NOTE — Telephone Encounter (Signed)
Called ms. Susan Wade to get a different time

## 2021-02-11 ENCOUNTER — Telehealth (INDEPENDENT_AMBULATORY_CARE_PROVIDER_SITE_OTHER): Payer: Self-pay | Admitting: Family Medicine

## 2021-02-11 ENCOUNTER — Encounter: Payer: Self-pay | Admitting: Family Medicine

## 2021-02-11 VITALS — Wt 132.0 lb

## 2021-02-11 DIAGNOSIS — F411 Generalized anxiety disorder: Secondary | ICD-10-CM

## 2021-02-11 DIAGNOSIS — F419 Anxiety disorder, unspecified: Secondary | ICD-10-CM

## 2021-02-11 MED ORDER — ESCITALOPRAM OXALATE 10 MG PO TABS: 10.0000 mg | ORAL_TABLET | Freq: Every day | ORAL | 1 refills | Status: DC

## 2021-02-11 MED ORDER — ALPRAZOLAM 0.5 MG PO TABS: 0.2500 mg | ORAL_TABLET | Freq: Two times a day (BID) | ORAL | 1 refills | Status: DC | PRN

## 2021-02-11 NOTE — Progress Notes (Signed)
I connected with Trenda S Hollern on 02/11/21 at  8:00 AM EDT by video and verified that I am speaking with the correct person using two identifiers.   I discussed the limitations, risks, security and privacy concerns of performing an evaluation and management service by video and the availability of in person appointments. I also discussed with the patient that there may be a patient responsible charge related to this service. The patient expressed understanding and agreed to proceed.  Patient location: Home Provider Location: [facility] Participants: Lynnda Child and [name]   Subjective:     Susan Wade is a 40 y.o. female presenting for Medication Problem     HPI  #Anxiety - had to take a week off work - was feeling spaced out and sweating - also some insomnia - has a lot of things going on - was 37.5 mg and then increased to 150 mg - still dealing with some brain fog - has been on this dose for 3 week  Xanax was needing more the first week Then transitioned to nighttime use for sleep due to insomnia  Failed: Zoloft  Review of Systems  01/08/2021: Mychart - doing well on effexor. Needing more xanax  Social History   Tobacco Use  Smoking Status Every Day   Packs/day: 0.50   Years: 17.00   Pack years: 8.50   Types: Cigarettes  Smokeless Tobacco Never        Objective:   BP Readings from Last 3 Encounters:  06/09/20 120/78  02/17/20 102/80  08/22/19 118/66   Wt Readings from Last 3 Encounters:  02/11/21 132 lb (59.9 kg)  12/02/20 128 lb (58.1 kg)  06/09/20 129 lb 8 oz (58.7 kg)   Physical Exam Constitutional:      Appearance: Normal appearance. She is not ill-appearing.  HENT:     Head: Normocephalic and atraumatic.     Right Ear: External ear normal.     Left Ear: External ear normal.  Eyes:     Conjunctiva/sclera: Conjunctivae normal.  Pulmonary:     Effort: Pulmonary effort is normal. No respiratory distress.   Neurological:     Mental Status: She is alert. Mental status is at baseline.  Psychiatric:        Mood and Affect: Mood normal.        Behavior: Behavior normal.        Thought Content: Thought content normal.        Judgment: Judgment normal.     GAD 7 : Generalized Anxiety Score 12/02/2020 06/09/2020  Nervous, Anxious, on Edge 3 3  Control/stop worrying 3 2  Worry too much - different things 3 0  Trouble relaxing 3 3  Restless 3 2  Easily annoyed or irritable 3 2  Afraid - awful might happen 2 0  Total GAD 7 Score 20 12  Anxiety Difficulty Somewhat difficult Somewhat difficult           Assessment & Plan:   Problem List Items Addressed This Visit       Other   Generalized anxiety disorder    Stable, but side effects with effexor (brain fog). Will switch to Lexapro 10 mg. She will increase to 20 mg in a few weeks if tolerating. Refill xanax for prn use. Also discussed if she fails lexapro will do trial of Buspar. However, given intolerance and difficulty with treatment plan will place referral to psych to help if unable to find  good option.       Relevant Medications   escitalopram (LEXAPRO) 10 MG tablet   ALPRAZolam (XANAX) 0.5 MG tablet   Other Visit Diagnoses     Anxiety    -  Primary   Relevant Medications   escitalopram (LEXAPRO) 10 MG tablet   ALPRAZolam (XANAX) 0.5 MG tablet   Other Relevant Orders   Ambulatory referral to Psychiatry        Return in about 8 weeks (around 04/08/2021) for anxiety.  Lynnda Child, MD

## 2021-02-11 NOTE — Assessment & Plan Note (Signed)
Stable, but side effects with effexor (brain fog). Will switch to Lexapro 10 mg. She will increase to 20 mg in a few weeks if tolerating. Refill xanax for prn use. Also discussed if she fails lexapro will do trial of Buspar. However, given intolerance and difficulty with treatment plan will place referral to psych to help if unable to find good option.

## 2021-02-11 NOTE — Patient Instructions (Signed)
Anxiety - Stop Effexor - Start Lexapro 10 mg - if tolerating but still some anxiety - then increase to 20 mg Lexapro  #Referral I have placed a referral to a specialist for you. You should receive a phone call from the specialty office. Make sure your voicemail is not full and that if you are able to answer your phone to unknown or new numbers.   It may take up to 2 weeks to hear about the referral. If you do not hear anything in 2 weeks, please call our office and ask to speak with the referral coordinator.   Return in 6 weeks to see another provider if things are not working out  As we discussed - could consider Buspar alone since you've had trouble with 3 medications now.

## 2021-03-25 ENCOUNTER — Telehealth: Payer: Self-pay | Admitting: Physician Assistant

## 2021-03-25 DIAGNOSIS — B379 Candidiasis, unspecified: Secondary | ICD-10-CM

## 2021-03-25 DIAGNOSIS — R3989 Other symptoms and signs involving the genitourinary system: Secondary | ICD-10-CM

## 2021-03-25 DIAGNOSIS — T3695XA Adverse effect of unspecified systemic antibiotic, initial encounter: Secondary | ICD-10-CM

## 2021-03-25 MED ORDER — SULFAMETHOXAZOLE-TRIMETHOPRIM 800-160 MG PO TABS: 1.0000 | ORAL_TABLET | Freq: Two times a day (BID) | ORAL | 0 refills | Status: DC

## 2021-03-25 MED ORDER — FLUCONAZOLE 150 MG PO TABS: 150.0000 mg | ORAL_TABLET | Freq: Once | ORAL | 0 refills | Status: AC

## 2021-03-25 NOTE — Progress Notes (Signed)
E-Visit for Urinary Problems  We are sorry that you are not feeling well.  Here is how we plan to help!  Based on what you shared with me it looks like you most likely have a simple urinary tract infection.  A UTI (Urinary Tract Infection) is a bacterial infection of the bladder.  Most cases of urinary tract infections are simple to treat but a key part of your care is to encourage you to drink plenty of fluids and watch your symptoms carefully.  I have prescribed Bactrim DS One tablet twice a day for 5 days.  Your symptoms should gradually improve. Call us if the burning in your urine worsens, you develop worsening fever, back pain or pelvic pain or if your symptoms do not resolve after completing the antibiotic.  I will also prescribe Diflucan 150mg  Take one tab once after completing antibiotic for any antibiotic induced yeast infection.  Urinary tract infections can be prevented by drinking plenty of water to keep your body hydrated.  Also be sure when you wipe, wipe from front to back and don't hold it in!  If possible, empty your bladder every 4 hours.  HOME CARE Drink plenty of fluids Compete the full course of the antibiotics even if the symptoms resolve Remember, when you need to go.go. Holding in your urine can increase the likelihood of getting a UTI! GET HELP RIGHT AWAY IF: You cannot urinate You get a high fever Worsening back pain occurs You see blood in your urine You feel sick to your stomach or throw up You feel like you are going to pass out  MAKE SURE YOU  Understand these instructions. Will watch your condition. Will get help right away if you are not doing well or get worse.   Thank you for choosing an e-visit.  Your e-visit answers were reviewed by a board certified advanced clinical practitioner to complete your personal care plan. Depending upon the condition, your plan could have included both over the counter or prescription medications.  Please review  your pharmacy choice. Make sure the pharmacy is open so you can pick up prescription now. If there is a problem, you may contact your provider through and have the prescription routed to another pharmacy.  Your safety is important to [employer]. If you have drug allergies check your prescription carefully.   For the next 24 hours you can use MyChart to ask questions about today's visit, request a non-urgent call back, or ask for a work or school excuse. You will get an email in the next two days asking about your experience. I hope that your e-visit has been valuable and will speed your recovery.  I provided 5 minutes of non face-to-face time during this encounter for chart review and documentation.

## 2021-03-29 ENCOUNTER — Telehealth: Payer: Self-pay | Admitting: Physician Assistant

## 2021-03-29 DIAGNOSIS — K0889 Other specified disorders of teeth and supporting structures: Secondary | ICD-10-CM

## 2021-03-29 MED ORDER — AMOXICILLIN-POT CLAVULANATE 875-125 MG PO TABS: 1.0000 | ORAL_TABLET | Freq: Two times a day (BID) | ORAL | 0 refills | Status: AC

## 2021-03-29 NOTE — Progress Notes (Signed)

## 2021-05-07 ENCOUNTER — Other Ambulatory Visit: Payer: Self-pay | Admitting: Family Medicine

## 2021-05-07 DIAGNOSIS — F419 Anxiety disorder, unspecified: Secondary | ICD-10-CM

## 2021-05-09 ENCOUNTER — Telehealth: Payer: Self-pay | Admitting: Family

## 2021-05-09 DIAGNOSIS — J029 Acute pharyngitis, unspecified: Secondary | ICD-10-CM

## 2021-05-09 MED ORDER — AMOXICILLIN 500 MG PO CAPS: 500.0000 mg | ORAL_CAPSULE | Freq: Two times a day (BID) | ORAL | 0 refills | Status: AC

## 2021-05-09 NOTE — Progress Notes (Signed)

## 2021-06-07 ENCOUNTER — Other Ambulatory Visit: Payer: Self-pay | Admitting: Primary Care

## 2021-06-07 ENCOUNTER — Other Ambulatory Visit: Payer: Self-pay | Admitting: Family Medicine

## 2021-06-07 DIAGNOSIS — F419 Anxiety disorder, unspecified: Secondary | ICD-10-CM

## 2021-07-12 ENCOUNTER — Other Ambulatory Visit: Payer: Self-pay | Admitting: Family Medicine

## 2021-07-12 DIAGNOSIS — F419 Anxiety disorder, unspecified: Secondary | ICD-10-CM

## 2021-07-14 ENCOUNTER — Encounter: Payer: Self-pay | Admitting: Family Medicine

## 2021-07-20 ENCOUNTER — Telehealth (INDEPENDENT_AMBULATORY_CARE_PROVIDER_SITE_OTHER): Payer: Self-pay | Admitting: Family Medicine

## 2021-07-20 ENCOUNTER — Encounter: Payer: Self-pay | Admitting: Family Medicine

## 2021-07-20 VITALS — Wt 135.0 lb

## 2021-07-20 DIAGNOSIS — Z800 Family history of malignant neoplasm of digestive organs: Secondary | ICD-10-CM

## 2021-07-20 DIAGNOSIS — F419 Anxiety disorder, unspecified: Secondary | ICD-10-CM

## 2021-07-20 DIAGNOSIS — F411 Generalized anxiety disorder: Secondary | ICD-10-CM

## 2021-07-20 DIAGNOSIS — Z803 Family history of malignant neoplasm of breast: Secondary | ICD-10-CM

## 2021-07-20 DIAGNOSIS — F320 Major depressive disorder, single episode, mild: Secondary | ICD-10-CM

## 2021-07-20 MED ORDER — ESCITALOPRAM OXALATE 20 MG PO TABS: 20.0000 mg | ORAL_TABLET | Freq: Every day | ORAL | 2 refills | Status: DC

## 2021-07-20 MED ORDER — ALPRAZOLAM 0.5 MG PO TABS: ORAL_TABLET | ORAL | 0 refills | Status: DC

## 2021-07-20 NOTE — Assessment & Plan Note (Signed)
Worse, increase lexapro to 20 mg ?

## 2021-07-20 NOTE — Progress Notes (Signed)
? ? ?I connected with Susan Wade on 07/20/21 at 11:00 AM EDT by video and verified that I am speaking with the correct person using two identifiers. ?  ?I discussed the limitations, risks, security and privacy concerns of performing an evaluation and management service by video and the availability of in person appointments. I also discussed with the patient that there may be a patient responsible charge related to this service. The patient expressed understanding and agreed to proceed. ? ?Patient location: Home ?Provider Location: [facility] ?Participants: Lynnda Child and Manasvini S Burcher ? ? ?Subjective:  ? ?  ?Susan Wade is a 41 y.o. female presenting for Medication Management (Lexapro and xanax) ?  ? ? ?HPI ? ?#anxiety ?- mom is on hospice currently ?- has some good and some bad days ?- mom has terminal breast cancer ?- Susan Wade is on FMLA leave and is caring for her mom on hospice ?- feels like the lexapro was helping initially but feels like this needs to increase ?- has been taking the xanax 1/2-1 tablet twice daily ?- taking for increased anxiety symptoms ?- worse over the last 2-3 weeks with her mom's diagnosis  ? ?Mom with breast cancer 36 ? ? ?Review of Systems ? ? ?Social History  ? ?Tobacco Use  ?Smoking Status Every Day  ? Packs/day: 0.50  ? Years: 17.00  ? Pack years: 8.50  ? Types: Cigarettes  ?Smokeless Tobacco Never  ? ? ? ?   ?Objective:  ? ?BP Readings from Last 3 Encounters:  ?06/09/20 120/78  ?02/17/20 102/80  ?08/22/19 118/66  ? ?Wt Readings from Last 3 Encounters:  ?07/20/21 135 lb (61.2 kg)  ?02/11/21 132 lb (59.9 kg)  ?12/02/20 128 lb (58.1 kg)  ? ?Wt 135 lb (61.2 kg)   LMP 05/30/2019   BMI 21.79 kg/m?  ? ? ?Physical Exam ?Constitutional:   ?   Appearance: Normal appearance. She is not ill-appearing.  ?HENT:  ?   Head: Normocephalic and atraumatic.  ?   Right Ear: External ear normal.  ?   Left Ear: External ear normal.  ?Eyes:  ?   Conjunctiva/sclera: Conjunctivae  normal.  ?Pulmonary:  ?   Effort: Pulmonary effort is normal. No respiratory distress.  ?Neurological:  ?   Mental Status: She is alert. Mental status is at baseline.  ?Psychiatric:     ?   Mood and Affect: Mood normal.     ?   Behavior: Behavior normal.     ?   Thought Content: Thought content normal.     ?   Judgment: Judgment normal.  ? ? ? ? ? ? ?   ?Assessment & Plan:  ? ?Problem List Items Addressed This Visit   ? ?  ? Other  ? Family history of colon cancer in father  ?  Father passed from colon cancer and now mother with breast cancer. Referral to Genetics.  ?  ?  ? Generalized anxiety disorder  ?  Worse in setting of care giving for mom who is terminally ill from breast cancer. Increase lexapro 10>20 mg. OK for temporarily needing xanax 0.5 mg BID but work to decrease to 1/2 tablet or prn. If unable to decrease xanax use over next 1-2 months anticipate adding buspar.  ?  ?  ? Relevant Medications  ? escitalopram (LEXAPRO) 20 MG tablet  ? ALPRAZolam (XANAX) 0.5 MG tablet  ? Major depressive disorder with current active episode  ?  Worse, increase  lexapro to 20 mg ?  ?  ? Relevant Medications  ? escitalopram (LEXAPRO) 20 MG tablet  ? ALPRAZolam (XANAX) 0.5 MG tablet  ? ?Other Visit Diagnoses   ? ? Anxiety    -  Primary  ? Relevant Medications  ? escitalopram (LEXAPRO) 20 MG tablet  ? ALPRAZolam (XANAX) 0.5 MG tablet  ? Family history of breast cancer      ? Relevant Orders  ? Ambulatory referral to Genetics  ? MM Digital Screening  ? Family history of colon cancer      ? Relevant Orders  ? Ambulatory referral to Genetics  ? ?  ? ? ? ?Return in about 6 weeks (around 08/31/2021) for anxiety. ? ?Lynnda Child, MD ? ?

## 2021-07-20 NOTE — Patient Instructions (Addendum)
Increase the lexapro to 10 mg ? ?Ok to take xanax twice daily for the next 1-2 weeks, but work to taper again.  ? ?Please call the location of your choice from the menu below to schedule your Mammogram and/or Bone Density appointment.   ? ?[place] ? ?[facility]   ?Phone:  [phone]   ?[address]                                                                            ?[place], [place] [address]                                            ?Services: 3D Mammogram and Bone Density ? ? ? ?

## 2021-07-20 NOTE — Assessment & Plan Note (Signed)
Father passed from colon cancer and now mother with breast cancer. Referral to Genetics.  ?

## 2021-07-20 NOTE — Assessment & Plan Note (Signed)
Worse in setting of care giving for mom who is terminally ill from breast cancer. Increase lexapro 10>20 mg. OK for temporarily needing xanax 0.5 mg BID but work to decrease to 1/2 tablet or prn. If unable to decrease xanax use over next 1-2 months anticipate adding buspar.  ?

## 2021-07-21 ENCOUNTER — Telehealth: Payer: Self-pay | Admitting: Urgent Care

## 2021-07-21 DIAGNOSIS — H00015 Hordeolum externum left lower eyelid: Secondary | ICD-10-CM

## 2021-07-21 MED ORDER — ERYTHROMYCIN 5 MG/GM OP OINT: 1.0000 "application " | TOPICAL_OINTMENT | Freq: Three times a day (TID) | OPHTHALMIC | 0 refills | Status: AC

## 2021-07-21 NOTE — Progress Notes (Signed)
We are sorry that you are not feeling well. Here is how we plan to help! ?  ?Based on what you have shared with me it looks like you have a stye.  A stye is an inflammation of the eyelid.  It is often a red, painful lump near the edge of the eyelid that may look like a boil or a pimple.  A stye develops when an infection occurs at the base of an eyelash.  ?  ?We have made appropriate suggestions for you based upon your presentation: ?Simple styes can be treated without medical intervention.  Most styes either resolve spontaneously or resolve with simple home treatment by applying warm compresses or heated washcloth to the stye for about 10-15 minutes three to four times a day. This causes the stye to drain and resolve. Another option would be to use Wynetta Emery and [place] baby shampoo on a warm washcloth and gently massage the eyelid. This can help remove any possible bacteria. If your symptoms continue to worsen, you may try the antibiotic ointment called in today, however supportive measures are recommended first.  ?  ?HOME CARE: ?  ?Wash your hands often! ?Let the stye open on its own. Don't squeeze or open it. ?Don't rub your eyes. This can irritate your eyes and let in bacteria.  If you need to touch your eyes, wash your hands first. ?Don't wear eye makeup or contact lenses until the area has healed. ?  ?GET HELP RIGHT AWAY IF: ?  ?Your symptoms do not improve. ?You develop blurred or loss of vision. ?Your symptoms worsen (increased discharge, pain or redness). ?  ?  ?Thank you for choosing an e-visit. ?  ?Your e-visit answers were reviewed by a board certified advanced clinical practitioner to complete your personal care plan. Depending upon the condition, your plan could have included both over the counter or prescription medications. ?  ?Please review your pharmacy choice. Make sure the pharmacy is open so you can pick up prescription now. If there is a problem, you may contact your provider through [employer] and have the prescription routed to another pharmacy.  Your safety is important to [place]. If you have drug allergies check your prescription carefully.  ?  ?For the next 24 hours you can use MyChart to ask questions about today's visit, request a non-urgent call back, or ask for a work or school excuse. ?You will get an email in the next two days asking about your experience. I hope that your e-visit has been valuable and will speed your recovery.  ? ? ?I have spent 5 minutes in review of e-visit questionnaire, review and updating patient chart, medical decision making and response to patient.  ? ?Rajesh Wyss L Irlanda Croghan, PA ? ?  ?

## 2021-10-08 ENCOUNTER — Encounter: Payer: Self-pay | Admitting: Family Medicine

## 2021-10-08 ENCOUNTER — Other Ambulatory Visit: Payer: Self-pay | Admitting: Family Medicine

## 2021-10-08 DIAGNOSIS — F419 Anxiety disorder, unspecified: Secondary | ICD-10-CM

## 2021-10-08 MED ORDER — ALPRAZOLAM 0.5 MG PO TABS: ORAL_TABLET | ORAL | 0 refills | Status: DC

## 2021-10-08 NOTE — Telephone Encounter (Signed)
Last refill: 07/20/21 #60 Last visit: video on 07/20/21

## 2021-10-13 NOTE — Addendum Note (Signed)
Addended by: Erby Pian on: 10/13/2021 03:25 PM   Modules accepted: Orders

## 2021-10-13 NOTE — Telephone Encounter (Signed)
Pharmacy states that they didn't receive the rx sent in on 10/08/21. It shows on our end that it went through but they state they didn't get it. Please resend.

## 2021-10-14 MED ORDER — ALPRAZOLAM 0.5 MG PO TABS: ORAL_TABLET | ORAL | 0 refills | Status: DC

## 2021-10-29 ENCOUNTER — Telehealth: Payer: Self-pay | Admitting: Family Medicine

## 2021-10-29 DIAGNOSIS — K0889 Other specified disorders of teeth and supporting structures: Secondary | ICD-10-CM

## 2021-10-30 MED ORDER — AMOXICILLIN 500 MG PO CAPS: 500.0000 mg | ORAL_CAPSULE | Freq: Three times a day (TID) | ORAL | 0 refills | Status: AC

## 2021-10-30 NOTE — Progress Notes (Signed)

## 2021-12-13 ENCOUNTER — Telehealth: Payer: Self-pay | Admitting: Nurse Practitioner

## 2021-12-13 DIAGNOSIS — Z20818 Contact with and (suspected) exposure to other bacterial communicable diseases: Secondary | ICD-10-CM

## 2021-12-13 MED ORDER — PENICILLIN V POTASSIUM 500 MG PO TABS: 500.0000 mg | ORAL_TABLET | Freq: Two times a day (BID) | ORAL | 0 refills | Status: AC

## 2021-12-13 NOTE — Progress Notes (Signed)
E-Visit for Sore Throat - Strep Symptoms  We are sorry that you are not feeling well.  Here is how we plan to help!  Based on what you have shared with me it is likely that you have strep pharyngitis.  Strep pharyngitis is inflammation and infection in the back of the throat.  This is an infection cause by bacteria and is treated with antibiotics.  I have prescribed Penicillin V 500 mg twice a day for 10 days. For throat pain, we recommend over the counter oral pain relief medications such as acetaminophen or aspirin, or anti-inflammatory medications such as ibuprofen or naproxen sodium. Topical treatments such as oral throat lozenges or sprays may be used as needed. Strep infections are not as easily transmitted as other respiratory infections, however we still recommend that you avoid close contact with loved ones, especially the very young and elderly.  Remember to wash your hands thoroughly throughout the day as this is the number one way to prevent the spread of infection and wipe down door knobs and counters with disinfectant.   Home Care: Only take medications as instructed by your medical team. Complete the entire course of an antibiotic. Do not take these medications with alcohol. A steam or ultrasonic humidifier can help congestion.  You can place a towel over your head and breathe in the steam from hot water coming from a faucet. Avoid close contacts especially the very young and the elderly. Cover your mouth when you cough or sneeze. Always remember to wash your hands.  Get Help Right Away If: You develop worsening fever or sinus pain. You develop a severe head ache or visual changes. Your symptoms persist after you have completed your treatment plan.  Make sure you Understand these instructions. Will watch your condition. Will get help right away if you are not doing well or get worse.   Thank you for choosing an e-visit.  Your e-visit answers were reviewed by a board  certified advanced clinical practitioner to complete your personal care plan. Depending upon the condition, your plan could have included both over the counter or prescription medications.  Please review your pharmacy choice. Make sure the pharmacy is open so you can pick up prescription now. If there is a problem, you may contact your provider through [employer] and have the prescription routed to another pharmacy.  Your safety is important to [place]. If you have drug allergies check your prescription carefully.   For the next 24 hours you can use MyChart to ask questions about today's visit, request a non-urgent call back, or ask for a work or school excuse. You will get an email in the next two days asking about your experience. I hope that your e-visit has been valuable and will speed your recovery.   Meds ordered this encounter  Medications   penicillin v potassium (VEETID) 500 MG tablet    Sig: Take 1 tablet (500 mg total) by mouth 2 (two) times daily for 10 days.    Dispense:  20 tablet    Refill:  0    I spent approximately 5 minutes reviewing the patient's history, current symptoms and coordinating their plan of care today.

## 2021-12-28 ENCOUNTER — Ambulatory Visit (INDEPENDENT_AMBULATORY_CARE_PROVIDER_SITE_OTHER): Payer: Self-pay | Admitting: Family Medicine

## 2021-12-28 VITALS — BP 90/60 | HR 64 | Temp 97.8°F | Wt 138.0 lb

## 2021-12-28 DIAGNOSIS — F411 Generalized anxiety disorder: Secondary | ICD-10-CM

## 2021-12-28 DIAGNOSIS — E782 Mixed hyperlipidemia: Secondary | ICD-10-CM

## 2021-12-28 DIAGNOSIS — F320 Major depressive disorder, single episode, mild: Secondary | ICD-10-CM

## 2021-12-28 DIAGNOSIS — F419 Anxiety disorder, unspecified: Secondary | ICD-10-CM

## 2021-12-28 LAB — COMPREHENSIVE METABOLIC PANEL
ALT: 9 U/L (ref 0–35)
AST: 15 U/L (ref 0–37)
Albumin: 4.3 g/dL (ref 3.5–5.2)
Alkaline Phosphatase: 53 U/L (ref 39–117)
BUN: 10 mg/dL (ref 6–23)
CO2: 27 mEq/L (ref 19–32)
Calcium: 9.5 mg/dL (ref 8.4–10.5)
Chloride: 107 mEq/L (ref 96–112)
Creatinine, Ser: 0.65 mg/dL (ref 0.40–1.20)
GFR: 110.46 mL/min (ref >60.00)
Glucose, Bld: 82 mg/dL (ref 70–99)
Potassium: 4.1 mEq/L (ref 3.5–5.1)
Sodium: 140 mEq/L (ref 135–145)
Total Bilirubin: 0.4 mg/dL (ref 0.2–1.2)
Total Protein: 7.3 g/dL (ref 6.0–8.3)

## 2021-12-28 LAB — LIPID PANEL
Cholesterol: 175 mg/dL (ref 0–200)
HDL: 52.9 mg/dL (ref >39.00)
LDL Cholesterol: 104 mg/dL — ABNORMAL HIGH (ref 0–99)
NonHDL: 122.32
Total CHOL/HDL Ratio: 3
Triglycerides: 91 mg/dL (ref 0.0–149.0)
VLDL: 18.2 mg/dL (ref 0.0–40.0)

## 2021-12-28 LAB — CBC
HCT: 44.7 % (ref 36.0–46.0)
Hemoglobin: 14.7 g/dL (ref 12.0–15.0)
MCHC: 32.9 g/dL (ref 30.0–36.0)
MCV: 89.5 fl (ref 78.0–100.0)
Platelets: 180 10*3/uL (ref 150.0–400.0)
RBC: 5 Mil/uL (ref 3.87–5.11)
RDW: 13.6 % (ref 11.5–15.5)
WBC: 7.1 10*3/uL (ref 4.0–10.5)

## 2021-12-28 MED ORDER — ESCITALOPRAM OXALATE 20 MG PO TABS: 20.0000 mg | ORAL_TABLET | Freq: Every day | ORAL | 1 refills | Status: DC

## 2021-12-28 MED ORDER — ALPRAZOLAM 0.5 MG PO TABS: ORAL_TABLET | ORAL | 0 refills | Status: DC

## 2021-12-28 MED ORDER — BUSPIRONE HCL 7.5 MG PO TABS: 7.5000 mg | ORAL_TABLET | Freq: Two times a day (BID) | ORAL | 1 refills | Status: DC

## 2021-12-28 NOTE — Patient Instructions (Addendum)
Try Buspar - twice daily  Week 1: 7.5 mg twice daily Week 2: 1.5 tablets in the morning and 1 tablet at night Week 3: Take 1.5 tablets twice daily  Update after a few weeks    If Buspar does not work - Effexor  - Dentist (check with insurance)   Sleep - ok to continue as needed xanax a few days a week - try taking lexapro at night to see if it makes a difference

## 2021-12-28 NOTE — Assessment & Plan Note (Signed)
Improved but not at goal. Has decreased xanax to a few times a week for sleep. Start buspar 7.5 mg bid increase up to 15 mg bid. Cont lexapro 20 mg. If not improving may need to switch to pristiq or psych referral.

## 2021-12-28 NOTE — Progress Notes (Signed)
Subjective:     Susan Wade is a 41 y.o. female presenting for Follow-up (Would like increase in lexapro)     HPI  #Anxiety - mother recently passed from breast cancer - father has already passed - dealing with a lot of anxiety around all the family loss - taking lexapro in the morning - taking 1/2 xanax to sleep at night - is s   Review of Systems  07/21/2021: Clinic - Anxiety worse - increase lexapro. Xanax 0.5 bid with plan to decrease as able. Buspar if not improved  Social History   Tobacco Use  Smoking Status Every Day   Packs/day: 0.50   Years: 17.00   Total pack years: 8.50   Types: Cigarettes  Smokeless Tobacco Never        Objective:    BP Readings from Last 3 Encounters:  12/28/21 90/60  06/09/20 120/78  02/17/20 102/80   Wt Readings from Last 3 Encounters:  12/28/21 138 lb (62.6 kg)  07/20/21 135 lb (61.2 kg)  02/11/21 132 lb (59.9 kg)    BP 90/60   Pulse 64   Temp 97.8 F (36.6 C) (Temporal)   Wt 138 lb (62.6 kg)   LMP 05/30/2019   SpO2 100%   BMI 22.27 kg/m    Physical Exam Constitutional:      General: She is not in acute distress.    Appearance: She is well-developed. She is not diaphoretic.  HENT:     Right Ear: External ear normal.     Left Ear: External ear normal.     Nose: Nose normal.  Eyes:     Conjunctiva/sclera: Conjunctivae normal.  Cardiovascular:     Rate and Rhythm: Normal rate.  Pulmonary:     Effort: Pulmonary effort is normal.  Musculoskeletal:     Cervical back: Neck supple.  Skin:    General: Skin is warm and dry.     Capillary Refill: Capillary refill takes less than 2 seconds.  Neurological:     Mental Status: She is alert. Mental status is at baseline.  Psychiatric:        Mood and Affect: Mood normal.        Behavior: Behavior normal.       12/28/2021    9:01 AM 07/20/2021   10:48 AM 12/02/2020    8:14 AM  Depression screen PHQ 2/9  Decreased Interest 2 0 1  Down, Depressed, Hopeless 2 2  1   PHQ - 2 Score 4 2 2   Altered sleeping 1 2 3   Tired, decreased energy 1 1 3   Change in appetite 0 1 2  Feeling bad or failure about yourself  0 0 0  Trouble concentrating 1 0 3  Moving slowly or fidgety/restless 0 0 3  Suicidal thoughts 0 0 0  PHQ-9 Score 7 6 16   Difficult doing work/chores Very difficult Somewhat difficult Somewhat difficult         12/28/2021    9:02 AM 07/20/2021   10:50 AM 12/02/2020    8:17 AM 06/09/2020    4:50 PM  GAD 7 : Generalized Anxiety Score  Nervous, Anxious, on Edge 3 3 3 3   Control/stop worrying 2 3 3 2   Worry too much - different things 2 3 3  0  Trouble relaxing 2 3 3 3   Restless 2 2 3 2   Easily annoyed or irritable 2 2 3 2   Afraid - awful might happen 2 2 2  0  Total GAD 7 Score  15 18 20 12   Anxiety Difficulty Very difficult Not difficult at all Somewhat difficult Somewhat difficult          Assessment & Plan:   Problem List Items Addressed This Visit       Other   Generalized anxiety disorder    Improved but not at goal. Has decreased xanax to a few times a week for sleep. Start buspar 7.5 mg bid increase up to 15 mg bid. Cont lexapro 20 mg. If not improving may need to switch to pristiq or psych referral.       Relevant Medications   busPIRone (BUSPAR) 7.5 MG tablet   escitalopram (LEXAPRO) 20 MG tablet   ALPRAZolam (XANAX) 0.5 MG tablet   Major depressive disorder with current active episode    See GAD plan. Start buspar 7.5 mg bid.       Relevant Medications   busPIRone (BUSPAR) 7.5 MG tablet   escitalopram (LEXAPRO) 20 MG tablet   ALPRAZolam (XANAX) 0.5 MG tablet   Other Visit Diagnoses     Anxiety    -  Primary   Relevant Medications   busPIRone (BUSPAR) 7.5 MG tablet   escitalopram (LEXAPRO) 20 MG tablet   ALPRAZolam (XANAX) 0.5 MG tablet   Other Relevant Orders   Comprehensive metabolic panel   CBC   Mixed hyperlipidemia       Relevant Orders   Lipid panel        Return in about 5 weeks (around  02/01/2022) for TOC with Tabitha for Anxiety f/u .  02/03/2022, MD

## 2021-12-28 NOTE — Assessment & Plan Note (Signed)
See GAD plan. Start buspar 7.5 mg bid.

## 2022-01-05 ENCOUNTER — Telehealth: Payer: Self-pay | Admitting: Physician Assistant

## 2022-01-05 DIAGNOSIS — K047 Periapical abscess without sinus: Secondary | ICD-10-CM

## 2022-01-05 MED ORDER — AMOXICILLIN 500 MG PO CAPS: 500.0000 mg | ORAL_CAPSULE | Freq: Three times a day (TID) | ORAL | 0 refills | Status: AC

## 2022-01-05 MED ORDER — IBUPROFEN 600 MG PO TABS: 600.0000 mg | ORAL_TABLET | Freq: Three times a day (TID) | ORAL | 0 refills | Status: DC | PRN

## 2022-01-05 NOTE — Progress Notes (Signed)

## 2022-02-15 ENCOUNTER — Telehealth: Payer: Self-pay | Admitting: Family Medicine

## 2022-02-15 DIAGNOSIS — R112 Nausea with vomiting, unspecified: Secondary | ICD-10-CM

## 2022-02-15 MED ORDER — ONDANSETRON HCL 4 MG PO TABS: 4.0000 mg | ORAL_TABLET | Freq: Three times a day (TID) | ORAL | 0 refills | Status: DC | PRN

## 2022-02-15 NOTE — Progress Notes (Signed)
E-Visit for Vomiting  We are sorry that you are not feeling well. Here is how we plan to help!  Based on what you have shared with me it looks like you have a Virus that is irritating your GI tract.  Vomiting is the forceful emptying of a portion of the stomach's content through the mouth.  Although nausea and vomiting can make you feel miserable, it's important to remember that these are not diseases, but rather symptoms of an underlying illness.  When we treat short term symptoms, we always caution that any symptoms that persist should be fully evaluated in a medical office.  I have prescribed a medication that will help alleviate your symptoms and allow you to stay hydrated:  Zofran 4 mg 1 tablet every 8 hours as needed for nausea and vomiting  HOME CARE: Drink clear liquids.  This is very important! Dehydration (the lack of fluid) can lead to a serious complication.  Start off with 1 tablespoon every 5 minutes for 8 hours. You may begin eating bland foods after 8 hours without vomiting.  Start with saltine crackers, white bread, rice, mashed potatoes, applesauce. After 48 hours on a bland diet, you may resume a normal diet. Try to go to sleep.  Sleep often empties the stomach and relieves the need to vomit.  GET HELP RIGHT AWAY IF:  Your symptoms do not improve or worsen within 2 days after treatment. You have a fever for over 3 days. You cannot keep down fluids after trying the medication.  MAKE SURE YOU:  Understand these instructions. Will watch your condition. Will get help right away if you are not doing well or get worse.   Thank you for choosing an e-visit.  Your e-visit answers were reviewed by a board certified advanced clinical practitioner to complete your personal care plan. Depending upon the condition, your plan could have included both over the counter or prescription medications.  Please review your pharmacy choice. Make sure the pharmacy is open so you can pick  up prescription now. If there is a problem, you may contact your provider through MyChart messaging and have the prescription routed to another pharmacy.  Your safety is important to us. If you have drug allergies check your prescription carefully.   For the next 24 hours you can use MyChart to ask questions about today's visit, request a non-urgent call back, or ask for a work or school excuse. You will get an email in the next two days asking about your experience. I hope that your e-visit has been valuable and will speed your recovery.  I provided 5 minutes of non face-to-face time during this encounter for chart review, medication and order placement, as well as and documentation.   

## 2022-03-05 ENCOUNTER — Telehealth: Payer: Self-pay | Admitting: Nurse Practitioner

## 2022-03-05 DIAGNOSIS — K219 Gastro-esophageal reflux disease without esophagitis: Secondary | ICD-10-CM

## 2022-03-05 MED ORDER — PANTOPRAZOLE SODIUM 40 MG PO TBEC: 40.0000 mg | DELAYED_RELEASE_TABLET | Freq: Every day | ORAL | 3 refills | Status: DC

## 2022-03-05 NOTE — Progress Notes (Signed)
E-Visit for Heartburn  We are sorry that you are not feeling well.  Here is how we plan to help!  Based on what you shared with me it looks like you most likely have Gastroesophageal Reflux Disease (GERD)  Gastroesophageal reflux disease (GERD) happens when acid from your stomach flows up into the esophagus.  When acid comes in contact with the esophagus, the acid causes sorenss (inflammation) in the esophagus.  Over time, GERD may create small holes (ulcers) in the lining of the esophagus.  I have prescribed Protonix 40 mg one by mouth daily until you follow up with a provider.  Your symptoms should improve in the next day or two.  You can use antacids as needed until symptoms resolve.  Call us if your heartburn worsens, you have trouble swallowing, weight loss, spitting up blood or recurrent vomiting.  Home Care: May include lifestyle changes such as weight loss, quitting smoking and alcohol consumption Avoid foods and drinks that make your symptoms worse, such as: Caffeine or alcoholic drinks Chocolate Peppermint or mint flavorings Garlic and onions Spicy foods Citrus fruits, such as oranges, lemons, or limes Tomato-based foods such as sauce, chili, salsa and pizza Fried and fatty foods Avoid lying down for 3 hours prior to your bedtime or prior to taking a nap Eat small, frequent meals instead of a large meals Wear loose-fitting clothing.  Do not wear anything tight around your waist that causes pressure on your stomach. Raise the head of your bed 6 to 8 inches with wood blocks to help you sleep.  Extra pillows will not help.  Seek Help Right Away If: You have pain in your arms, neck, jaw, teeth or back Your pain increases or changes in intensity or duration You develop nausea, vomiting or sweating (diaphoresis) You develop shortness of breath or you faint Your vomit is green, yellow, black or looks like coffee grounds or blood Your stool is red, bloody or black  These  symptoms could be signs of other problems, such as heart disease, gastric bleeding or esophageal bleeding.  Make sure you : Understand these instructions. Will watch your condition. Will get help right away if you are not doing well or get worse.  Your e-visit answers were reviewed by a board certified advanced clinical practitioner to complete your personal care plan.  Depending on the condition, your plan could have included both over the counter or prescription medications.  If there is a problem please reply  once you have received a response from your provider.  Your safety is important to [place].  If you have drug allergies check your prescription carefully.    You can use MyChart to ask questions about today's visit, request a non-urgent call back, or ask for a work or school excuse for 24 hours related to this e-Visit. If it has been greater than 24 hours you will need to follow up with your provider, or enter a new e-Visit to address those concerns.  You will get an e-mail in the next two days asking about your experience.  I hope that your e-visit has been valuable and will speed your recovery. Thank you for using e-visits.  Susan Daphine Deutscher, FNP   5-10 minutes spent reviewing and documenting in chart.

## 2022-03-24 ENCOUNTER — Telehealth: Payer: Self-pay | Admitting: Physician Assistant

## 2022-03-24 DIAGNOSIS — B9789 Other viral agents as the cause of diseases classified elsewhere: Secondary | ICD-10-CM

## 2022-03-24 DIAGNOSIS — J019 Acute sinusitis, unspecified: Secondary | ICD-10-CM

## 2022-03-24 MED ORDER — PREDNISONE 20 MG PO TABS: 40.0000 mg | ORAL_TABLET | Freq: Every day | ORAL | 0 refills | Status: DC

## 2022-03-24 MED ORDER — FLUTICASONE PROPIONATE 50 MCG/ACT NA SUSP: 2.0000 | Freq: Every day | NASAL | 0 refills | Status: DC

## 2022-03-24 NOTE — Progress Notes (Signed)
E-Visit for Sinus Problems  We are sorry that you are not feeling well.  Here is how we plan to help!  Based on what you have shared with me it looks like you have sinusitis.  Sinusitis is inflammation and infection in the sinus cavities of the head.  Based on your presentation I believe you most likely have Acute Viral Sinusitis.This is an infection most likely caused by a virus. There is not specific treatment for viral sinusitis other than to help you with the symptoms until the infection runs its course.  You may use an oral decongestant such as Mucinex D or if you have glaucoma or high blood pressure use plain Mucinex. Saline nasal spray help and can safely be used as often as needed for congestion, I have prescribed: Fluticasone nasal spray two sprays in each nostril once a day  I have also prescribed a short burst of prednisone to reduce sinus inflammation and hopefully expedite recovery.   Some authorities believe that zinc sprays or the use of Echinacea may shorten the course of your symptoms.  Sinus infections are not as easily transmitted as other respiratory infection, however we still recommend that you avoid close contact with loved ones, especially the very young and elderly.  Remember to wash your hands thoroughly throughout the day as this is the number one way to prevent the spread of infection!  Home Care: Only take medications as instructed by your medical team. Do not take these medications with alcohol. A steam or ultrasonic humidifier can help congestion.  You can place a towel over your head and breathe in the steam from hot water coming from a faucet. Avoid close contacts especially the very young and the elderly. Cover your mouth when you cough or sneeze. Always remember to wash your hands.  Get Help Right Away If: You develop worsening fever or sinus pain. You develop a severe head ache or visual changes. Your symptoms persist after you have completed your treatment  plan.  Make sure you Understand these instructions. Will watch your condition. Will get help right away if you are not doing well or get worse.   Thank you for choosing an e-visit.  Your e-visit answers were reviewed by a board certified advanced clinical practitioner to complete your personal care plan. Depending upon the condition, your plan could have included both over the counter or prescription medications.  Please review your pharmacy choice. Make sure the pharmacy is open so you can pick up prescription now. If there is a problem, you may contact your provider through [employer] and have the prescription routed to another pharmacy.  Your safety is important to [place]. If you have drug allergies check your prescription carefully.   For the next 24 hours you can use MyChart to ask questions about today's visit, request a non-urgent call back, or ask for a work or school excuse. You will get an email in the next two days asking about your experience. I hope that your e-visit has been valuable and will speed your recovery.

## 2022-03-24 NOTE — Progress Notes (Signed)
I have spent 5 minutes in review of e-visit questionnaire, review and updating patient chart, medical decision making and response to patient.   Geddy Boydstun Cody Takara Sermons, PA-C    

## 2022-04-22 ENCOUNTER — Encounter: Payer: Self-pay | Admitting: Family

## 2022-04-22 DIAGNOSIS — F419 Anxiety disorder, unspecified: Secondary | ICD-10-CM

## 2022-04-22 MED ORDER — ALPRAZOLAM 0.5 MG PO TABS: ORAL_TABLET | ORAL | 0 refills | Status: DC

## 2022-04-25 MED ORDER — BUSPIRONE HCL 7.5 MG PO TABS: 7.5000 mg | ORAL_TABLET | Freq: Two times a day (BID) | ORAL | 1 refills | Status: DC

## 2022-04-25 NOTE — Addendum Note (Signed)
Addended by: Eugenia Pancoast on: 04/25/2022 01:52 PM   Modules accepted: Orders

## 2022-05-25 ENCOUNTER — Other Ambulatory Visit: Payer: Self-pay | Admitting: Family

## 2022-05-25 DIAGNOSIS — F419 Anxiety disorder, unspecified: Secondary | ICD-10-CM

## 2022-06-21 ENCOUNTER — Other Ambulatory Visit: Payer: Self-pay | Admitting: Family

## 2022-06-21 DIAGNOSIS — F419 Anxiety disorder, unspecified: Secondary | ICD-10-CM

## 2022-06-22 ENCOUNTER — Encounter: Payer: Self-pay | Admitting: Family

## 2022-06-22 DIAGNOSIS — F419 Anxiety disorder, unspecified: Secondary | ICD-10-CM

## 2022-06-22 NOTE — Telephone Encounter (Signed)
See other telephone message 

## 2022-06-22 NOTE — Telephone Encounter (Signed)
Refill request for ALPRAZolam (XANAX) 0.5 MG tablet ( LR- 05/25/22 @ 30tabs/no refills); escitalopram (LEXAPRO) 20 MG tablet ( LR- 12/28/21 @ 30tabs/1 refill). LV- 12/28/21; NV- 09/02/22 (rescheduled due to having Flu). Ok to send meds?

## 2022-06-23 ENCOUNTER — Encounter: Payer: Self-pay | Admitting: Family

## 2022-06-23 MED ORDER — ESCITALOPRAM OXALATE 20 MG PO TABS: 20.0000 mg | ORAL_TABLET | Freq: Every day | ORAL | 0 refills | Status: DC

## 2022-08-12 ENCOUNTER — Telehealth: Payer: Self-pay | Admitting: Family Medicine

## 2022-08-12 DIAGNOSIS — H1030 Unspecified acute conjunctivitis, unspecified eye: Secondary | ICD-10-CM

## 2022-08-13 ENCOUNTER — Telehealth: Payer: Self-pay | Admitting: Family

## 2022-08-13 DIAGNOSIS — H109 Unspecified conjunctivitis: Secondary | ICD-10-CM

## 2022-08-13 MED ORDER — POLYMYXIN B-TRIMETHOPRIM 10000-0.1 UNIT/ML-% OP SOLN: 2.0000 [drp] | Freq: Four times a day (QID) | OPHTHALMIC | 0 refills | Status: AC

## 2022-08-13 NOTE — Progress Notes (Signed)

## 2022-08-14 NOTE — Progress Notes (Signed)
E-Visit for [employer]   We are sorry that you are not feeling well.  Here is how we plan to help! Thank you for the picture!  Based on what you have shared with me it looks like you have conjunctivitis.  Conjunctivitis is a common inflammatory or infectious condition of the eye that is often referred to as "pink eye".  In most cases it is contagious (viral or bacterial). However, not all conjunctivitis requires antibiotics (ex. Allergic).  We have made appropriate suggestions for you based upon your presentation.  Please continue the antibiotic drops that were sent yesterday to your pharmacy.   Pink eye can be highly contagious.  It is typically spread through direct contact with secretions, or contaminated objects or surfaces that one may have touched.  Strict handwashing is suggested with soap and water is urged.  If not available, use alcohol based had sanitizer.  Avoid unnecessary touching of the eye.  If you wear contact lenses, you will need to refrain from wearing them until you see no white discharge from the eye for at least 24 hours after being on medication.  You should see symptom improvement in 1-2 days after starting the medication regimen.  Call us if symptoms are not improved in 1-2 days.  Home Care: Wash your hands often! Do not wear your contacts until you complete your treatment plan. Avoid sharing towels, bed linen, personal items with a person who has pink eye. See attention for anyone in your home with similar symptoms.  Get Help Right Away If: Your symptoms do not improve. You develop blurred or loss of vision. Your symptoms worsen (increased discharge, pain or redness)   Thank you for choosing an e-visit.  Your e-visit answers were reviewed by a board certified advanced clinical practitioner to complete your personal care plan. Depending upon the condition, your plan could have included both over the counter or prescription medications.  Please review your pharmacy  choice. Make sure the pharmacy is open so you can pick up prescription now. If there is a problem, you may contact your provider through [employer] and have the prescription routed to another pharmacy.  Your safety is important to [place]. If you have drug allergies check your prescription carefully.   For the next 24 hours you can use MyChart to ask questions about today's visit, request a non-urgent call back, or ask for a work or school excuse. You will get an email in the next two days asking about your experience. I hope that your e-visit has been valuable and will speed your recovery.  Approximately 5 minutes was spent documenting and reviewing patient's chart.

## 2022-09-02 ENCOUNTER — Ambulatory Visit (INDEPENDENT_AMBULATORY_CARE_PROVIDER_SITE_OTHER): Payer: Self-pay | Admitting: Family

## 2022-09-02 ENCOUNTER — Encounter: Payer: Self-pay | Admitting: Family

## 2022-09-02 VITALS — BP 124/72 | HR 78 | Temp 98.6°F | Ht 66.0 in | Wt 136.0 lb

## 2022-09-02 DIAGNOSIS — F419 Anxiety disorder, unspecified: Secondary | ICD-10-CM

## 2022-09-02 DIAGNOSIS — F411 Generalized anxiety disorder: Secondary | ICD-10-CM

## 2022-09-02 DIAGNOSIS — K219 Gastro-esophageal reflux disease without esophagitis: Secondary | ICD-10-CM

## 2022-09-02 MED ORDER — BUPROPION HCL ER (XL) 150 MG PO TB24: 150.0000 mg | ORAL_TABLET | Freq: Every day | ORAL | 1 refills | Status: DC

## 2022-09-02 MED ORDER — HYDROXYZINE PAMOATE 25 MG PO CAPS
ORAL_CAPSULE | ORAL | 1 refills | Status: DC
Start: 2022-09-02 — End: 2023-12-26

## 2022-09-02 MED ORDER — ALPRAZOLAM 0.5 MG PO TABS: ORAL_TABLET | ORAL | 2 refills | Status: DC

## 2022-09-02 NOTE — Assessment & Plan Note (Signed)
Stable

## 2022-09-02 NOTE — Progress Notes (Signed)
Established Patient Office Visit  Subjective:  Patient ID: Susan Wade Vital, female    DOB: 03-Sep-1980  Age: 42 y.o. MRN: [id number]  CC:  Chief Complaint  Patient presents with  . Establish Care    TOC from Dr Selena Batten  . Anxiety    HPI Susan Wade is here for a transition of care visit.  Prior provider was: Dr. Gweneth Dimitri   Pt is with acute concerns.   Tetanus: 1985  Hysterectomy: came out for endometriosis. No abnormal pap history.   chronic concerns:  Anxiety: takes xanax but very rarely, 1-2 times a week and sometimes when she doesn't sleep very well she will take this but otherwise does well. Tried buspirone but broke out in hives. She has tried ashwaghanda in the past. She is still on the lexapro 20 mg once daily. She does state sometimes worse than others, she just feel as though lexapro is working however she is easily agitated and or snaps at the kids more than usual. She does find she worries constantly about her health and life in general.   Cigarette smoker, 17 pack year. Trying to work on quitting.   Past Medical History:  Diagnosis Date  . Scoliosis     Past Surgical History:  Procedure Laterality Date  . COLONOSCOPY    . CYSTOSCOPY N/A 05/30/2019   Procedure: CYSTOSCOPY;  Surgeon: Vena Austria, MD;  Location: [facility] ORS;  Service: Gynecology;  Laterality: N/A;  . TOTAL LAPAROSCOPIC HYSTERECTOMY WITH SALPINGECTOMY N/A 05/30/2019   Procedure: TOTAL LAPAROSCOPIC HYSTERECTOMY WITH BILATERAL SALPINGECTOMY;  Surgeon: Vena Austria, MD;  Location: [facility] ORS;  Service: Gynecology;  Laterality: N/A;  . TUBAL LIGATION  2011  . UPPER GI ENDOSCOPY      Family History  Problem Relation Age of Onset  . Hypertension Mother   . Hyperlipidemia Mother   . Breast cancer Mother 72  . Colon cancer Father 49  . Rectal cancer Father   . Healthy Sister   . Colon polyps Sister 30  . Breast cancer Maternal Grandmother 54  . Hyperlipidemia Maternal Grandfather   .  Hypertension Maternal Grandfather   . Diabetes Paternal Grandmother   . Heart disease Paternal Grandmother   . Hyperlipidemia Paternal Grandmother   . Hypertension Paternal Grandmother   . Colon cancer Paternal Grandfather   . Colon cancer Cousin 35  . Colon cancer Cousin 43  . Breast cancer Other 57    Social History   Socioeconomic History  . Marital status: Married    Spouse name: Susan Wade  . Number of children: 2  . Years of education: high school  . Highest education level: Not on file  Occupational History  . Occupation: receptionist  Tobacco Use  . Smoking status: Every Day    Packs/day: 0.50    Years: 17.00    Additional pack years: 0.00    Total pack years: 8.50    Types: Cigarettes  . Smokeless tobacco: Never  Vaping Use  . Vaping Use: Never used  Substance and Sexual Activity  . Alcohol use: Yes    Comment: glass of wine a few nights a week  . Drug use: Never  . Sexual activity: Yes    Partners: Male    Birth control/protection: Surgical  Other Topics Concern  . Not on file  Social History Narrative   03/21/19   From: the area   Living: with husband and children   Work: receptionist      Family: Susan Wade and  Susan Wade (2011 and 2006)      Enjoys: hiking, go to [employer] (family in [facility])      Exercise: going to the gym - walking the treadmill, weight lifting   Diet: fruits, veggies, fish/chicken, some red meats      Safety   Seat belts: Yes    Guns: No   Safe in relationships: Yes    Social Determinants of Health   Financial Resource Strain: Low Risk  (03/21/2019)   Overall Financial Resource Strain (CARDIA)   . Difficulty of Paying Living Expenses: Not hard at all  Food Insecurity: Not on file  Transportation Needs: Not on file  Physical Activity: Not on file  Stress: Not on file  Social Connections: Not on file  Intimate Partner Violence: Not on file    Outpatient Medications Prior to Visit  Medication Sig Dispense Refill  .  escitalopram (LEXAPRO) 20 MG tablet Take 1 tablet (20 mg total) by mouth daily. 90 tablet 0  . ibuprofen (ADVIL) 600 MG tablet Take 1 tablet (600 mg total) by mouth every 8 (eight) hours as needed. 30 tablet 0  . ALPRAZolam (XANAX) 0.5 MG tablet TAKE ONE-HALF TO ONE TABLET BY MOUTH TWICE DAILY AS NEEDED FOR ANXIETY OR FOR SLEEP 30 tablet 0  . busPIRone (BUSPAR) 7.5 MG tablet Take 1 tablet (7.5 mg total) by mouth 2 (two) times daily. (Patient not taking: Reported on 09/02/2022) 60 tablet 1  . fluticasone (FLONASE) 50 MCG/ACT nasal spray Place 2 sprays into both nostrils daily. (Patient not taking: Reported on 09/02/2022) 16 g 0  . hydrOXYzine (ATARAX/VISTARIL) 50 MG tablet Take 0.5-2 tablets (25-100 mg total) by mouth 3 (three) times daily as needed for anxiety or nausea. (Patient not taking: Reported on 09/02/2022) 30 tablet 1  . omeprazole (PRILOSEC) 40 MG capsule Take 1 capsule (40 mg total) by mouth daily. (Patient not taking: Reported on 09/02/2022) 30 capsule 3  . ondansetron (ZOFRAN) 4 MG tablet Take 1 tablet (4 mg total) by mouth every 8 (eight) hours as needed for nausea or vomiting. (Patient not taking: Reported on 09/02/2022) 15 tablet 0  . pantoprazole (PROTONIX) 40 MG tablet Take 1 tablet (40 mg total) by mouth daily. (Patient not taking: Reported on 09/02/2022) 30 tablet 3  . predniSONE (DELTASONE) 20 MG tablet Take 2 tablets (40 mg total) by mouth daily with breakfast. (Patient not taking: Reported on 09/02/2022) 6 tablet 0   No facility-administered medications prior to visit.    Allergies  Allergen Reactions  . Buspirone Hives    ROS: Pertinent symptoms negative unless otherwise noted in HPI      Objective:    Physical Exam  Gen: NAD, resting comfortably CV: RRR with no murmurs appreciated Pulm: NWOB, CTAB with no crackles, wheezes, or rhonchi Skin: warm, dry Psych: Normal affect and thought content  BP 124/72 (BP Location: Left Arm)   Pulse 78   Temp 98.6 F (37 C)  (Temporal)   Ht 5\' 6"  (1.676 m)   Wt 136 lb (61.7 kg)   LMP 05/30/2019   SpO2 99%   BMI 21.95 kg/m  Wt Readings from Last 3 Encounters:  09/02/22 136 lb (61.7 kg)  12/28/21 138 lb (62.6 kg)  07/20/21 135 lb (61.2 kg)     Health Maintenance Due  Topic Date Due  . MAMMOGRAM  Never done    There are no preventive care reminders to display for this patient.  Lab Results  Component Value Date  TSH 1.36 06/09/2020   Lab Results  Component Value Date   WBC 7.1 12/28/2021   HGB 14.7 12/28/2021   HCT 44.7 12/28/2021   MCV 89.5 12/28/2021   PLT 180.0 12/28/2021   Lab Results  Component Value Date   NA 140 12/28/2021   K 4.1 12/28/2021   CO2 27 12/28/2021   GLUCOSE 82 12/28/2021   BUN 10 12/28/2021   CREATININE 0.65 12/28/2021   BILITOT 0.4 12/28/2021   ALKPHOS 53 12/28/2021   AST 15 12/28/2021   ALT 9 12/28/2021   PROT 7.3 12/28/2021   ALBUMIN 4.3 12/28/2021   CALCIUM 9.5 12/28/2021   ANIONGAP 9 06/02/2019   GFR 110.46 12/28/2021   Lab Results  Component Value Date   CHOL 175 12/28/2021   Lab Results  Component Value Date   HDL 52.90 12/28/2021   Lab Results  Component Value Date   LDLCALC 104 (H) 12/28/2021   Lab Results  Component Value Date   TRIG 91.0 12/28/2021   Lab Results  Component Value Date   CHOLHDL 3 12/28/2021   No results found for: "HGBA1C"    Assessment & Plan:   Gastroesophageal reflux disease, unspecified whether esophagitis present Assessment & Plan: Stable.    Generalized anxiety disorder -     buPROPion HCl ER (XL); Take 1 tablet (150 mg total) by mouth daily.  Dispense: 90 tablet; Refill: 1  Anxiety -     ALPRAZolam; TAKE ONE-HALF TO ONE TABLET BY MOUTH TWICE DAILY AS NEEDED FOR ANXIETY OR FOR SLEEP  Dispense: 30 tablet; Refill: 2 -     hydrOXYzine Pamoate; Take 1-2 tablets po qhs prn anxiety and or sleep  Dispense: 90 capsule; Refill: 1    Meds ordered this encounter  Medications  . buPROPion (WELLBUTRIN XL)  150 MG 24 hr tablet    Sig: Take 1 tablet (150 mg total) by mouth daily.    Dispense:  90 tablet    Refill:  1    Order Specific Question:   Supervising Provider    Answer:   BEDSOLE, AMY E [[id number]]  . ALPRAZolam (XANAX) 0.5 MG tablet    Sig: TAKE ONE-HALF TO ONE TABLET BY MOUTH TWICE DAILY AS NEEDED FOR ANXIETY OR FOR SLEEP    Dispense:  30 tablet    Refill:  2    Order Specific Question:   Supervising Provider    Answer:   BEDSOLE, AMY E [[id number]]  . hydrOXYzine (VISTARIL) 25 MG capsule    Sig: Take 1-2 tablets po qhs prn anxiety and or sleep    Dispense:  90 capsule    Refill:  1    Order Specific Question:   Supervising Provider    Answer:   Ermalene Searing, AMY E [[id number]]    Follow-up: Return in about 1 year (around 09/02/2023) for f/u CPE.    Mort Sawyers, FNP

## 2022-09-07 NOTE — Assessment & Plan Note (Addendum)
Stable Continue and refill sent for lexapro 20 mg once daily  Start trial wellbutrin 150 mg xl once daily  Pdmp reviewed.  Send in alprazolam prn anxiety and also hydroxyxine 1-2 tablets qhs prn anxiety/sleep  Will try to use hydroxyxine in place of xanax to cut down on need for benzodiazepine.

## 2022-09-26 ENCOUNTER — Encounter: Payer: Self-pay | Admitting: Family

## 2022-11-01 ENCOUNTER — Encounter (INDEPENDENT_AMBULATORY_CARE_PROVIDER_SITE_OTHER): Payer: Self-pay | Admitting: Family

## 2022-11-01 DIAGNOSIS — F411 Generalized anxiety disorder: Secondary | ICD-10-CM

## 2022-11-01 DIAGNOSIS — F419 Anxiety disorder, unspecified: Secondary | ICD-10-CM

## 2022-11-01 DIAGNOSIS — F320 Major depressive disorder, single episode, mild: Secondary | ICD-10-CM

## 2022-11-01 MED ORDER — SERTRALINE HCL 100 MG PO TABS
100.0000 mg | ORAL_TABLET | Freq: Every day | ORAL | 0 refills | Status: DC
Start: 2022-11-01 — End: 2023-01-06

## 2022-11-01 NOTE — Addendum Note (Signed)
Addended by: Mort Sawyers on: 11/01/2022 02:26 PM   Modules accepted: Orders

## 2022-11-01 NOTE — Telephone Encounter (Signed)
Please see MyChart message concerning Wellbutrin and refills needed on Alprazolam and Lexapro.  I've asked the pt to call the office and st up an appointment.

## 2022-11-02 MED ORDER — ALPRAZOLAM 0.5 MG PO TABS
ORAL_TABLET | ORAL | 0 refills | Status: DC
Start: 2022-11-02 — End: 2023-01-06

## 2022-11-02 NOTE — Addendum Note (Signed)
Addended by: Mort Sawyers on: 11/02/2022 12:26 PM   Modules accepted: Orders

## 2022-11-02 NOTE — Telephone Encounter (Signed)
Please see the MyChart message reply(ies) for my assessment and plan.  The patient gave consent for this Medical Advice Message and is aware that it may result in a bill to their insurance company as well as the possibility that this may result in a co-payment or deductible. They are an established patient, but are not seeking medical advice exclusively about a problem treated during an in person or video visit in the last 7 days. I did not recommend an in person or video visit within 7 days of my reply.  I spent a total of 10 minutes cumulative time within 7 days through [employer] Mort Sawyers, FNP

## 2022-11-09 ENCOUNTER — Telehealth: Payer: Self-pay | Admitting: Nurse Practitioner

## 2022-11-09 DIAGNOSIS — R3989 Other symptoms and signs involving the genitourinary system: Secondary | ICD-10-CM

## 2022-11-09 MED ORDER — CEPHALEXIN 500 MG PO CAPS
500.0000 mg | ORAL_CAPSULE | Freq: Two times a day (BID) | ORAL | 0 refills | Status: AC
Start: 2022-11-09 — End: 2022-11-16

## 2022-11-09 NOTE — Progress Notes (Signed)
E-Visit for Urinary Problems  We are sorry that you are not feeling well.  Here is how we plan to help!  Based on what you shared with me it looks like you most likely have a simple urinary tract infection.  A UTI (Urinary Tract Infection) is a bacterial infection of the bladder.  Most cases of urinary tract infections are simple to treat but a key part of your care is to encourage you to drink plenty of fluids and watch your symptoms carefully.  I have prescribed Keflex 500 mg twice a day for 7 days.  Your symptoms should gradually improve. Call us if the burning in your urine worsens, you develop worsening fever, back pain or pelvic pain or if your symptoms do not resolve after completing the antibiotic.  Urinary tract infections can be prevented by drinking plenty of water to keep your body hydrated.  Also be sure when you wipe, wipe from front to back and don't hold it in!  If possible, empty your bladder every 4 hours.  HOME CARE Drink plenty of fluids Compete the full course of the antibiotics even if the symptoms resolve Remember, when you need to go.go. Holding in your urine can increase the likelihood of getting a UTI! GET HELP RIGHT AWAY IF: You cannot urinate You get a high fever Worsening back pain occurs You see blood in your urine You feel sick to your stomach or throw up You feel like you are going to pass out  MAKE SURE YOU  Understand these instructions. Will watch your condition. Will get help right away if you are not doing well or get worse.   Thank you for choosing an e-visit.  Your e-visit answers were reviewed by a board certified advanced clinical practitioner to complete your personal care plan. Depending upon the condition, your plan could have included both over the counter or prescription medications.  Please review your pharmacy choice. Make sure the pharmacy is open so you can pick up prescription now. If there is a problem, you may contact your  provider through MyChart messaging and have the prescription routed to another pharmacy.  Your safety is important to us. If you have drug allergies check your prescription carefully.   For the next 24 hours you can use MyChart to ask questions about today's visit, request a non-urgent call back, or ask for a work or school excuse. You will get an email in the next two days asking about your experience. I hope that your e-visit has been valuable and will speed your recovery.   Meds ordered this encounter  Medications   cephALEXin (KEFLEX) 500 MG capsule    Sig: Take 1 capsule (500 mg total) by mouth 2 (two) times daily for 7 days.    Dispense:  14 capsule    Refill:  0    I spent approximately 5 minutes reviewing the patient's history, current symptoms and coordinating their care today.   

## 2022-11-10 ENCOUNTER — Telehealth: Payer: Self-pay | Admitting: Physician Assistant

## 2022-11-10 DIAGNOSIS — R102 Pelvic and perineal pain: Secondary | ICD-10-CM

## 2022-11-10 DIAGNOSIS — R300 Dysuria: Secondary | ICD-10-CM

## 2022-11-10 NOTE — Progress Notes (Signed)
Because you were already evaluated yesterday for suspected UTI and started on treatment, now with change in symptoms, you need an exam and testing. As such, I feel your condition warrants further evaluation and I recommend that you be seen in a face to face visit.   NOTE: There will be NO CHARGE for this eVisit   If you are having a true medical emergency please call 911.      For an urgent face to face visit, [facility] has eight urgent care centers for your convenience:   NEW!! [facility] Health Urgent Care Center at [facility] Get Driving Directions [phone] [address] [place], [address]    [facility] Health Urgent Care Center at [facility] Get Driving Directions [phone] [address] [place], [place] [address]   [facility] Health Urgent Care Center [facility]) Get Driving Directions [phone] [address] [place], [place] [address]  [facility] Health Urgent Care Center [facility]) Get Driving Directions [phone] [address] [place],  [place]  [address]  [facility] Health Urgent Care Center [facility] - at [employer]  [phone] [address] [place],  [place] [address]   [facility] Health Urgent Care at [facility] Get Driving Directions [phone] [address] [place], [place] [address]   [facility] Health Urgent Care at [facility] Get Driving Directions  [phone] [address] [place], [place] [address]   [facility] Health Urgent Care at [facility] Directions [phone] [address], [address] [place], [place] [address]  Your MyChart E-visit questionnaire answers were reviewed by a board certified advanced clinical practitioner to complete your personal care plan based on your specific symptoms.  Thank you for using e-Visits.

## 2022-11-11 ENCOUNTER — Telehealth: Payer: Self-pay | Admitting: Family Medicine

## 2022-11-11 DIAGNOSIS — B9689 Other specified bacterial agents as the cause of diseases classified elsewhere: Secondary | ICD-10-CM

## 2022-11-11 DIAGNOSIS — N760 Acute vaginitis: Secondary | ICD-10-CM

## 2022-11-11 MED ORDER — CLINDAMYCIN PHOSPHATE 2 % VA CREA: 1.0000 | TOPICAL_CREAM | Freq: Every day | VAGINAL | 0 refills | Status: DC

## 2022-11-11 NOTE — Progress Notes (Signed)

## 2022-11-18 ENCOUNTER — Telehealth: Payer: Self-pay | Admitting: Family Medicine

## 2022-11-18 DIAGNOSIS — B3731 Acute candidiasis of vulva and vagina: Secondary | ICD-10-CM

## 2022-11-18 MED ORDER — FLUCONAZOLE 150 MG PO TABS
150.0000 mg | ORAL_TABLET | Freq: Once | ORAL | 0 refills | Status: AC
Start: 2022-11-18 — End: 2022-11-18

## 2022-11-18 NOTE — Progress Notes (Signed)

## 2022-11-26 ENCOUNTER — Telehealth: Payer: Self-pay | Admitting: Family Medicine

## 2022-11-26 DIAGNOSIS — N760 Acute vaginitis: Secondary | ICD-10-CM

## 2022-11-26 MED ORDER — FLUCONAZOLE 150 MG PO TABS: 150.0000 mg | ORAL_TABLET | Freq: Once | ORAL | 0 refills | Status: AC

## 2022-11-26 NOTE — Progress Notes (Signed)

## 2022-12-02 ENCOUNTER — Telehealth: Payer: Self-pay | Admitting: Nurse Practitioner

## 2022-12-02 ENCOUNTER — Encounter: Payer: Self-pay | Admitting: Family

## 2022-12-02 DIAGNOSIS — N760 Acute vaginitis: Secondary | ICD-10-CM

## 2022-12-02 NOTE — Progress Notes (Signed)
Susan Wade,  I am concerned about your recurrent symptoms over the past month. I also apologize I see where you had messaged me but I did not get that message in my inbox. I apologize for not responding.   Overall due to the recurrent nature with evolving symptoms I do think it is best someone evaluates you in person so you can have the best plan moving forward.   If you have an OBGYN that would be the best next step, if not you could also reach out to your primary care provider. I know it is coming up on the weekend and this may be difficult, and Urgent Care would also be able to perform a vaginal exam and testing if needed.   All the best Susan Wade   I feel your condition warrants further evaluation and I recommend that you be seen for a face to face visit.  Please contact your primary care physician practice to be seen. Many offices offer virtual options to be seen via video if you are not comfortable going in person to a medical facility at this time.  NOTE: You will NOT be charged for this eVisit.  If you do not have a PCP, [facility] offers a free physician referral service available at [phone]. Our trained staff has the experience, knowledge and resources to put you in touch with a physician who is right for you.    If you are having a true medical emergency please call 911.   Your e-visit answers were reviewed by a board certified advanced clinical practitioner to complete your personal care plan.  Thank you for using e-Visits.

## 2022-12-05 ENCOUNTER — Encounter: Payer: Self-pay | Admitting: Family

## 2022-12-05 ENCOUNTER — Ambulatory Visit (INDEPENDENT_AMBULATORY_CARE_PROVIDER_SITE_OTHER): Payer: Self-pay | Admitting: Family

## 2022-12-05 VITALS — BP 134/70 | HR 63 | Temp 98.0°F | Ht 66.0 in | Wt 130.6 lb

## 2022-12-05 DIAGNOSIS — N898 Other specified noninflammatory disorders of vagina: Secondary | ICD-10-CM | POA: Insufficient documentation

## 2022-12-05 DIAGNOSIS — R3915 Urgency of urination: Secondary | ICD-10-CM

## 2022-12-05 DIAGNOSIS — F411 Generalized anxiety disorder: Secondary | ICD-10-CM

## 2022-12-05 DIAGNOSIS — R350 Frequency of micturition: Secondary | ICD-10-CM | POA: Insufficient documentation

## 2022-12-05 MED ORDER — FLUCONAZOLE 150 MG PO TABS
ORAL_TABLET | ORAL | 0 refills | Status: DC
Start: 2022-12-05 — End: 2023-11-08

## 2022-12-05 NOTE — Progress Notes (Signed)
Established Patient Office Visit  Subjective:      CC:  Chief Complaint  Patient presents with   vaginal discomfort    Pt stated that she has been going thru some episodes off/on of some vaginal burning/itching. Not on the outside but more in the inside. No discharge or foul smell. There is some pressure to go the the bathroom more     HPI: Susan Wade is a 42 y.o. female presenting on 12/05/2022 for vaginal discomfort (Pt stated that she has been going thru some episodes off/on of some vaginal burning/itching. Not on the outside but more in the inside. No discharge or foul smell. There is some pressure to go the the bathroom more ) . For the last one month or so she states she has vaginal itching in the vaginal opening. She states it feels like it itches more after peeing. When she is having sex the symptoms improve for a few days. She doesn't see any vaginal discharge. Slight increased urinary frequency. She will have urinary urgency with some retention. She does not have urinary incontinence. No blood seen in urine.   She was given diflucan and also clindamycin without much improvement symptoms are still around. Comes and goes, does feel better but then symptoms come back.   Has since changed her soaps to non fragrance. Using vagisil after which helps the itch slightly but otherwise not.   Has had a hysterectomy in 2021. Still with ovaries.   Had e visit in regards to these symptoms 7/24, 7/25, 7/26,   Anxiety doing much better on sertraline 100 mg once daily.      Social history:  Relevant past medical, surgical, family and social history reviewed and updated as indicated. Interim medical history since our last visit reviewed.  Allergies and medications reviewed and updated.  DATA REVIEWED: CHART IN EPIC     ROS: Negative unless specifically indicated above in HPI.    Current Outpatient Medications:    ALPRAZolam (XANAX) 0.5 MG tablet, Take one tablet twice  daily prn anxiety, Disp: 60 tablet, Rfl: 0   fluconazole (DIFLUCAN) 150 MG tablet, Take one po every day for one dose then repeat in three days if still with symptoms., Disp: 2 tablet, Rfl: 0   hydrOXYzine (VISTARIL) 25 MG capsule, Take 1-2 tablets po qhs prn anxiety and or sleep, Disp: 90 capsule, Rfl: 1   sertraline (ZOLOFT) 100 MG tablet, Take 1 tablet (100 mg total) by mouth daily., Disp: 90 tablet, Rfl: 0      Objective:    BP 134/70   Pulse 63   Temp 98 F (36.7 C)   Ht 5\' 6"  (1.676 m)   Wt 130 lb 9.6 oz (59.2 kg)   LMP 05/30/2019   SpO2 97%   BMI 21.08 kg/m   Wt Readings from Last 3 Encounters:  12/05/22 130 lb 9.6 oz (59.2 kg)  09/02/22 136 lb (61.7 kg)  12/28/21 138 lb (62.6 kg)    Physical Exam Constitutional:      General: She is not in acute distress.    Appearance: Normal appearance. She is normal weight. She is not ill-appearing, toxic-appearing or diaphoretic.  HENT:     Head: Normocephalic.  Cardiovascular:     Rate and Rhythm: Normal rate.  Pulmonary:     Effort: Pulmonary effort is normal.  Genitourinary:    Vagina: Vaginal discharge (white thick discharge) and erythema (vaginal wall and vaginal introitus) present. No tenderness.  Comments: No cervix due to hysterectomy  Musculoskeletal:        General: Normal range of motion.  Neurological:     General: No focal deficit present.     Mental Status: She is alert and oriented to person, place, and time. Mental status is at baseline.  Psychiatric:        Mood and Affect: Mood normal.        Behavior: Behavior normal.        Thought Content: Thought content normal.        Judgment: Judgment normal.           Assessment & Plan:  Vaginal itching Assessment & Plan: Pelvic exam in office today  Suspected yeast infection due to PE and discharge Ordering wet prep pending results however choosing to treat as pt symptomatic.  Rx diflucan 150 mg once daily for one dose, repeat if still with sx in  three days  Orders: -     WET PREP BY MOLECULAR PROBE -     Urinalysis w microscopic + reflex cultur -     Fluconazole; Take one po every day for one dose then repeat in three days if still with symptoms.  Dispense: 2 tablet; Refill: 0  Urinary urgency -     Urinalysis w microscopic + reflex cultur  Urinary frequency Assessment & Plan: R/o uti  Urinalysis with reflex culture ordered today pending results.   Orders: -     Urinalysis w microscopic + reflex cultur  Generalized anxiety disorder Assessment & Plan: Improved.       Return if symptoms worsen or fail to improve.  Mort Sawyers, MSN, APRN, FNP-C [facility]

## 2022-12-05 NOTE — Assessment & Plan Note (Signed)
Improved

## 2022-12-05 NOTE — Assessment & Plan Note (Signed)
R/o uti  Urinalysis with reflex culture ordered today pending results.

## 2022-12-05 NOTE — Assessment & Plan Note (Addendum)
Pelvic exam in office today  Suspected yeast infection due to PE and discharge Ordering wet prep pending results however choosing to treat as pt symptomatic.  Rx diflucan 150 mg once daily for one dose, repeat if still with sx in three days

## 2022-12-06 ENCOUNTER — Encounter: Payer: Self-pay | Admitting: Family

## 2022-12-06 DIAGNOSIS — R3915 Urgency of urination: Secondary | ICD-10-CM

## 2022-12-06 DIAGNOSIS — R350 Frequency of micturition: Secondary | ICD-10-CM

## 2022-12-06 DIAGNOSIS — R311 Benign essential microscopic hematuria: Secondary | ICD-10-CM

## 2022-12-06 DIAGNOSIS — R300 Dysuria: Secondary | ICD-10-CM

## 2022-12-06 DIAGNOSIS — N898 Other specified noninflammatory disorders of vagina: Secondary | ICD-10-CM

## 2022-12-06 LAB — URINALYSIS W MICROSCOPIC + REFLEX CULTURE
Bacteria, UA: NONE SEEN /HPF
Bilirubin Urine: NEGATIVE
Glucose, UA: NEGATIVE
Hyaline Cast: NONE SEEN /LPF
Leukocyte Esterase: NEGATIVE
Nitrites, Initial: NEGATIVE
Specific Gravity, Urine: 1.031 (ref 1.001–1.035)
WBC, UA: NONE SEEN /HPF (ref 0–5)
pH: 5.5 (ref 5.0–8.0)

## 2022-12-06 LAB — WET PREP BY MOLECULAR PROBE
Candida species: NOT DETECTED
Gardnerella vaginalis: NOT DETECTED
MICRO NUMBER:: 15349259
SPECIMEN QUALITY:: ADEQUATE
Trichomonas vaginosis: NOT DETECTED

## 2022-12-06 LAB — NO CULTURE INDICATED

## 2022-12-09 ENCOUNTER — Encounter: Payer: Self-pay | Admitting: *Deleted

## 2022-12-09 NOTE — Addendum Note (Signed)
Addended by: Mort Sawyers on: 12/09/2022 08:08 AM   Modules accepted: Orders

## 2022-12-14 ENCOUNTER — Other Ambulatory Visit: Payer: Self-pay

## 2022-12-14 DIAGNOSIS — R3915 Urgency of urination: Secondary | ICD-10-CM

## 2022-12-14 DIAGNOSIS — R350 Frequency of micturition: Secondary | ICD-10-CM

## 2022-12-14 DIAGNOSIS — R300 Dysuria: Secondary | ICD-10-CM

## 2022-12-14 DIAGNOSIS — R311 Benign essential microscopic hematuria: Secondary | ICD-10-CM

## 2022-12-14 DIAGNOSIS — N898 Other specified noninflammatory disorders of vagina: Secondary | ICD-10-CM

## 2022-12-15 LAB — C. TRACHOMATIS/N. GONORRHOEAE RNA
C. trachomatis RNA, TMA: NOT DETECTED
N. gonorrhoeae RNA, TMA: NOT DETECTED

## 2022-12-15 LAB — TRICHOMONAS VAGINALIS, PROBE AMP: Trichomonas vaginalis RNA: NOT DETECTED

## 2022-12-22 ENCOUNTER — Ambulatory Visit: Payer: Self-pay

## 2022-12-24 NOTE — Addendum Note (Signed)
Addended byReed Pandy on: 12/24/2022 10:06 AM   Modules accepted: Level of Service

## 2023-01-06 ENCOUNTER — Ambulatory Visit: Payer: Self-pay

## 2023-01-06 ENCOUNTER — Other Ambulatory Visit: Payer: Self-pay | Admitting: Family

## 2023-01-06 DIAGNOSIS — F411 Generalized anxiety disorder: Secondary | ICD-10-CM

## 2023-01-06 DIAGNOSIS — F320 Major depressive disorder, single episode, mild: Secondary | ICD-10-CM

## 2023-01-06 DIAGNOSIS — F419 Anxiety disorder, unspecified: Secondary | ICD-10-CM

## 2023-01-06 NOTE — Telephone Encounter (Signed)
Is pt still taking alprazolam twice daily? According to her pharmacy fills it appears she is, and that just lets me know that her anxiety is not well controlled at her current sertraline dose. How is she feeling, peaks of anxiety still?   The goal is not to be on alprazolam daily and to try to wean down to where she does not need this but a few times a week or month. To do this we need to identify stressors and causes, and maybe increase or change sertraline.

## 2023-01-06 NOTE — Telephone Encounter (Signed)
See note below to inquire of pt.

## 2023-01-06 NOTE — Telephone Encounter (Signed)
Spoke with pt. States that she is taking her Alprazolam BID. She is okay with increasing her Sertraline dose to whatever Susan Wade feels is best.

## 2023-01-19 ENCOUNTER — Ambulatory Visit
Admission: RE | Admit: 2023-01-19 | Discharge: 2023-01-19 | Disposition: A | Discharge status: Home / Self Care | Payer: Self-pay | Source: Ambulatory Visit | Attending: Family | Admitting: Family

## 2023-01-19 DIAGNOSIS — R300 Dysuria: Secondary | ICD-10-CM

## 2023-01-19 DIAGNOSIS — R3915 Urgency of urination: Secondary | ICD-10-CM

## 2023-01-19 DIAGNOSIS — R350 Frequency of micturition: Secondary | ICD-10-CM | POA: Insufficient documentation

## 2023-01-19 DIAGNOSIS — N898 Other specified noninflammatory disorders of vagina: Secondary | ICD-10-CM

## 2023-01-19 DIAGNOSIS — R311 Benign essential microscopic hematuria: Secondary | ICD-10-CM | POA: Insufficient documentation

## 2023-01-23 ENCOUNTER — Encounter: Payer: Self-pay | Admitting: Family

## 2023-01-24 ENCOUNTER — Encounter: Payer: Self-pay | Admitting: Family

## 2023-01-25 NOTE — Telephone Encounter (Signed)
Can we call [facility] and ask for the report from 10/3 on transvaginal u/s?  [facility] outpatient imaging center [address], [place] [address] Phone [phone]-  8-5 pm

## 2023-01-31 ENCOUNTER — Encounter: Payer: Self-pay | Admitting: Family

## 2023-01-31 DIAGNOSIS — F419 Anxiety disorder, unspecified: Secondary | ICD-10-CM

## 2023-01-31 DIAGNOSIS — N3090 Cystitis, unspecified without hematuria: Secondary | ICD-10-CM

## 2023-02-01 MED ORDER — AMITRIPTYLINE HCL 25 MG PO TABS: 25.0000 mg | ORAL_TABLET | Freq: Every day | ORAL | 0 refills | Status: DC

## 2023-02-01 MED ORDER — ALPRAZOLAM 0.5 MG PO TABS
ORAL_TABLET | ORAL | 2 refills | Status: DC
Start: 2023-02-01 — End: 2023-05-21

## 2023-02-01 NOTE — Addendum Note (Signed)
Addended by: Mort Sawyers on: 02/01/2023 11:59 AM   Modules accepted: Orders

## 2023-02-03 ENCOUNTER — Other Ambulatory Visit: Payer: Self-pay

## 2023-02-03 ENCOUNTER — Ambulatory Visit: Payer: Self-pay

## 2023-02-06 ENCOUNTER — Ambulatory Visit: Payer: Self-pay | Admitting: Family

## 2023-03-06 ENCOUNTER — Encounter: Payer: Self-pay | Admitting: Family

## 2023-03-06 NOTE — Telephone Encounter (Signed)
I spoke with pt and on 03/02/23 started with on and off tingling in lips, feels like tip of tongue feels burned and sweet taste in mouth along with shakiness on and off. Pt said she is not diabetic and the sweet taste, shakiness, and tingling of lips is gone. Pt said if touches tip of tongue feels like tongue is burned on the end. Pt has not eaten or drank any very hot foods or drink.pt said she has never had before. No new toothpaste or mouthwash.No swelling in mouth, throat, tongue or neck and no difficulty breathing Pt said she is in no distress and does not want to go to UC or ED. Pt scheduled appt with Dr Alphonsus Sias on 03/07/23 at 10:15. T Dugal FNP does not have any available appts. UC and ED precautions given and pt voiced understanding. Sending note to Dr Alphonsus Sias and Lorain Childes to Hayden Pedro FNP as PCP.

## 2023-03-06 NOTE — Telephone Encounter (Signed)
Okay--I will check her tomorrow 

## 2023-03-06 NOTE — Telephone Encounter (Signed)
Please call and triage sounds like it may be an allergic reaction to something. Likely will need to see more urgently.

## 2023-03-07 ENCOUNTER — Encounter: Payer: Self-pay | Admitting: Family

## 2023-03-07 ENCOUNTER — Encounter: Payer: Self-pay | Admitting: Internal Medicine

## 2023-03-07 ENCOUNTER — Ambulatory Visit (INDEPENDENT_AMBULATORY_CARE_PROVIDER_SITE_OTHER): Payer: Self-pay | Admitting: Internal Medicine

## 2023-03-07 VITALS — BP 110/80 | HR 69 | Temp 98.4°F | Ht 66.0 in | Wt 131.0 lb

## 2023-03-07 DIAGNOSIS — F320 Major depressive disorder, single episode, mild: Secondary | ICD-10-CM

## 2023-03-07 DIAGNOSIS — N3010 Interstitial cystitis (chronic) without hematuria: Secondary | ICD-10-CM

## 2023-03-07 DIAGNOSIS — R432 Parageusia: Secondary | ICD-10-CM | POA: Insufficient documentation

## 2023-03-07 DIAGNOSIS — T7840XA Allergy, unspecified, initial encounter: Secondary | ICD-10-CM

## 2023-03-07 NOTE — Telephone Encounter (Signed)
Noted. Thank you for seeing her Dr. Alphonsus Sias.

## 2023-03-07 NOTE — Telephone Encounter (Signed)
Thank you for seeing Nadyne today.  Per your note you stated that she might be able to try imipramine. Do you find that with a possible allergic reaction to elavil that she wouldn't have one with imipramine as they are both TCAs?

## 2023-03-07 NOTE — Progress Notes (Signed)
Subjective:    Patient ID: Susan Wade, female    DOB: May 06, 1980, 42 y.o.   MRN: [id number]  HPI Here due to sensation changes on tongue and burning  Has mouth tingling and burning--started a week ago Around her mouth---feels like VIck's vaporub Sweet taste for a while as well Gets sense of shakiness also---like her sugar has dropped----better if drinks juice  Has been on amitriptyline for 6 weeks Helping bladder No other medication changes  Current Outpatient Medications on File Prior to Visit  Medication Sig Dispense Refill   ALPRAZolam (XANAX) 0.5 MG tablet TAKE ONE TABLET BY MOUTH TWICE A DAY AS NEEDED FOR ANXIETY 60 tablet 2   amitriptyline (ELAVIL) 25 MG tablet Take 1 tablet (25 mg total) by mouth at bedtime. 90 tablet 0   fluconazole (DIFLUCAN) 150 MG tablet Take one po every day for one dose then repeat in three days if still with symptoms. 2 tablet 0   hydrOXYzine (VISTARIL) 25 MG capsule Take 1-2 tablets po qhs prn anxiety and or sleep 90 capsule 1   sertraline (ZOLOFT) 100 MG tablet Take two tablets once daily (total 200 mg) 180 tablet 0   No current facility-administered medications on file prior to visit.    Allergies  Allergen Reactions   Buspirone Hives    Past Medical History:  Diagnosis Date   Scoliosis     Past Surgical History:  Procedure Laterality Date   COLONOSCOPY     CYSTOSCOPY N/A 05/30/2019   Procedure: CYSTOSCOPY;  Surgeon: Vena Austria, MD;  Location: [facility] ORS;  Service: Gynecology;  Laterality: N/A;   TOTAL LAPAROSCOPIC HYSTERECTOMY WITH SALPINGECTOMY N/A 05/30/2019   Procedure: TOTAL LAPAROSCOPIC HYSTERECTOMY WITH BILATERAL SALPINGECTOMY;  Surgeon: Vena Austria, MD;  Location: [facility] ORS;  Service: Gynecology;  Laterality: N/A;   TUBAL LIGATION  2011   UPPER GI ENDOSCOPY      Family History  Problem Relation Age of Onset   Hypertension Mother    Hyperlipidemia Mother    Breast cancer Mother 64   Colon cancer Father 51    Rectal cancer Father    Healthy Sister    Colon polyps Sister 37   Breast cancer Maternal Grandmother 37   Hyperlipidemia Maternal Grandfather    Hypertension Maternal Grandfather    Diabetes Paternal Grandmother    Heart disease Paternal Grandmother    Hyperlipidemia Paternal Grandmother    Hypertension Paternal Grandmother    Colon cancer Paternal Grandfather    Colon cancer Cousin 35   Colon cancer Cousin 3   Breast cancer Other 84    Social History   Socioeconomic History   Marital status: Married    Spouse name: Apolinar Junes   Number of children: 2   Years of education: high school   Highest education level: Not on file  Occupational History   Occupation: receptionist  Tobacco Use   Smoking status: Every Day    Current packs/day: 0.50    Average packs/day: 0.5 packs/day for 17.0 years (8.5 ttl pk-yrs)    Types: Cigarettes   Smokeless tobacco: Never  Vaping Use   Vaping status: Never Used  Substance and Sexual Activity   Alcohol use: Yes    Comment: glass of wine a few nights a week   Drug use: Never   Sexual activity: Yes    Partners: Male    Birth control/protection: Surgical  Other Topics Concern   Not on file  Social History Narrative   03/21/19   From: the  area   Living: with husband and children   Work: Scientist, physiological      Family: Verda Cumins and Museum/gallery exhibitions officer (2011 and 2006)      Enjoys: hiking, go to [employer] (family in [place])      Exercise: going to the gym - walking the treadmill, weight lifting   Diet: fruits, veggies, fish/chicken, some red meats      Safety   Seat belts: Yes    Guns: No   Safe in relationships: Yes    Social Determinants of Health   Financial Resource Strain: Low Risk  (03/21/2019)   Overall Financial Resource Strain (CARDIA)    Difficulty of Paying Living Expenses: Not hard at all  Food Insecurity: Not on file  Transportation Needs: Not on file  Physical Activity: Not on file  Stress: Not on file  Social Connections: Not on  file  Intimate Partner Violence: Not on file   Review of Systems No illnesses No fever No respiratory symptoms No mouth burns or trauma Rare glass of wine occasionally    Objective:   Physical Exam HENT:     Mouth/Throat:     Pharynx: No oropharyngeal exudate or posterior oropharyngeal erythema.     Comments: No obvious tongue lesions Some tiny red dots on tip of tongue No thrush Musculoskeletal:     Cervical back: Neck supple.  Lymphadenopathy:     Cervical: No cervical adenopathy.            Assessment & Plan:

## 2023-03-07 NOTE — Assessment & Plan Note (Signed)
Most likely related to the amitriptyline Will have her stop this--for now Try a multivitamin just in case  If symptoms resolve off the amitriptyline, she can rechallenge at half dose If that also causes symptoms, could try imipramine for the bladder

## 2023-03-09 DIAGNOSIS — N3010 Interstitial cystitis (chronic) without hematuria: Secondary | ICD-10-CM | POA: Insufficient documentation

## 2023-03-09 MED ORDER — IMIPRAMINE HCL 50 MG PO TABS
50.0000 mg | ORAL_TABLET | Freq: Every day | ORAL | 0 refills | Status: DC
Start: 2023-03-09 — End: 2023-03-21

## 2023-03-09 NOTE — Addendum Note (Signed)
Addended by: Mort Sawyers on: 03/09/2023 08:40 AM   Modules accepted: Orders

## 2023-03-10 MED ORDER — PREDNISONE 10 MG (21) PO TBPK
ORAL_TABLET | ORAL | 0 refills | Status: DC
Start: 2023-03-10 — End: 2023-03-20

## 2023-03-10 NOTE — Addendum Note (Signed)
Addended by: Mort Sawyers on: 03/10/2023 06:27 AM   Modules accepted: Orders

## 2023-03-12 ENCOUNTER — Encounter: Payer: Self-pay | Admitting: Family

## 2023-03-12 DIAGNOSIS — R432 Parageusia: Secondary | ICD-10-CM

## 2023-03-12 DIAGNOSIS — K146 Glossodynia: Secondary | ICD-10-CM

## 2023-03-12 DIAGNOSIS — R7989 Other specified abnormal findings of blood chemistry: Secondary | ICD-10-CM

## 2023-03-12 DIAGNOSIS — Z862 Personal history of diseases of the blood and blood-forming organs and certain disorders involving the immune mechanism: Secondary | ICD-10-CM

## 2023-03-15 ENCOUNTER — Other Ambulatory Visit (INDEPENDENT_AMBULATORY_CARE_PROVIDER_SITE_OTHER): Payer: Self-pay

## 2023-03-15 DIAGNOSIS — K146 Glossodynia: Secondary | ICD-10-CM

## 2023-03-15 DIAGNOSIS — R432 Parageusia: Secondary | ICD-10-CM

## 2023-03-15 DIAGNOSIS — Z862 Personal history of diseases of the blood and blood-forming organs and certain disorders involving the immune mechanism: Secondary | ICD-10-CM

## 2023-03-15 DIAGNOSIS — R7989 Other specified abnormal findings of blood chemistry: Secondary | ICD-10-CM

## 2023-03-15 NOTE — Addendum Note (Signed)
Addended by: Alvina Chou on: 03/15/2023 09:17 AM   Modules accepted: Orders

## 2023-03-19 ENCOUNTER — Encounter: Payer: Self-pay | Admitting: Family

## 2023-03-19 ENCOUNTER — Other Ambulatory Visit: Payer: Self-pay | Admitting: Family

## 2023-03-20 LAB — TIER 2
Jo-1 Autoabs: <1 AI
SSA (Ro) (ENA) Antibody, IgG: <1 AI
SSB (La) (ENA) Antibody, IgG: <1 AI
Scleroderma (Scl-70) (ENA) Antibody, IgG: <1 AI

## 2023-03-20 LAB — CBC WITH DIFFERENTIAL/PLATELET
Absolute Lymphocytes: 1968 {cells}/uL (ref 850–3900)
Absolute Monocytes: 1251 {cells}/uL — ABNORMAL HIGH (ref 200–950)
Basophils Absolute: 41 {cells}/uL (ref 0–200)
Basophils Relative: 0.2 %
Eosinophils Absolute: 21 {cells}/uL (ref 15–500)
Eosinophils Relative: 0.1 %
HCT: 45.4 % — ABNORMAL HIGH (ref 35.0–45.0)
Hemoglobin: 15 g/dL (ref 11.7–15.5)
MCH: 29.8 pg (ref 27.0–33.0)
MCHC: 33 g/dL (ref 32.0–36.0)
MCV: 90.3 fL (ref 80.0–100.0)
MPV: 10.8 fL (ref 7.5–12.5)
Monocytes Relative: 6.1 %
Neutro Abs: 17220 {cells}/uL — ABNORMAL HIGH (ref 1500–7800)
Neutrophils Relative %: 84 %
Platelets: 269 10*3/uL (ref 140–400)
RBC: 5.03 10*6/uL (ref 3.80–5.10)
RDW: 12.3 % (ref 11.0–15.0)
Total Lymphocyte: 9.6 %
WBC: 20.5 10*3/uL — ABNORMAL HIGH (ref 3.8–10.8)

## 2023-03-20 LAB — ANA SCREEN,IFA,REFLEX TITER/PATTERN,REFLEX MPLX 11 AB CASCADE
Anti Nuclear Antibody (ANA): POSITIVE — AB
Cyclic Citrullin Peptide Ab: <16 U
MUTATED CITRULLINATED VIMENTIN (MCV) AB: <20 U/mL (ref <20)
Rheumatoid fact SerPl-aCnc: <10 [IU]/mL (ref <14)

## 2023-03-20 LAB — TIER 1
Chromatin (Nucleosomal) Antibody: <1 AI
ENA SM Ab Ser-aCnc: <1 AI
Ribonucleic Protein(ENA) Antibody, IgG: <1 AI
SM/RNP: <1 AI
ds DNA Ab: 1 [IU]/mL

## 2023-03-20 LAB — COMPREHENSIVE METABOLIC PANEL
AG Ratio: 1.6 (calc) (ref 1.0–2.5)
ALT: 7 U/L (ref 6–29)
AST: 10 U/L (ref 10–30)
Albumin: 4.6 g/dL (ref 3.6–5.1)
Alkaline phosphatase (APISO): 56 U/L (ref 31–125)
BUN: 11 mg/dL (ref 7–25)
CO2: 24 mmol/L (ref 20–32)
Calcium: 9.9 mg/dL (ref 8.6–10.2)
Chloride: 103 mmol/L (ref 98–110)
Creat: 0.74 mg/dL (ref 0.50–0.99)
Globulin: 2.9 g/dL (ref 1.9–3.7)
Glucose, Bld: 85 mg/dL (ref 65–99)
Potassium: 3.8 mmol/L (ref 3.5–5.3)
Sodium: 141 mmol/L (ref 135–146)
Total Bilirubin: 0.4 mg/dL (ref 0.2–1.2)
Total Protein: 7.5 g/dL (ref 6.1–8.1)

## 2023-03-20 LAB — ANTI-NUCLEAR AB-TITER (ANA TITER)
ANA TITER: 1:40 {titer} — ABNORMAL HIGH
ANA Titer 1: 1:40 {titer} — ABNORMAL HIGH

## 2023-03-20 LAB — TSH: TSH: 0.78 m[IU]/L

## 2023-03-20 LAB — THYROID PEROXIDASE ANTIBODIES (TPO) (REFL): Thyroperoxidase Ab SerPl-aCnc: 1 [IU]/mL (ref <9)

## 2023-03-20 LAB — INTERPRETATION

## 2023-03-20 LAB — SEDIMENTATION RATE: Sed Rate: 2 mm/h (ref 0–20)

## 2023-03-20 LAB — TIER 3
Centromere Ab Screen: <1 AI
Ribosomal P Protein Ab: <1 AI

## 2023-03-20 LAB — T3, FREE: T3, Free: 2.6 pg/mL (ref 2.3–4.2)

## 2023-03-20 LAB — T4, FREE: Free T4: 1.2 ng/dL (ref 0.8–1.8)

## 2023-03-20 LAB — VITAMIN B12: Vitamin B-12: 303 pg/mL (ref 200–1100)

## 2023-03-20 NOTE — Telephone Encounter (Signed)
Have her get in to be seen this needs to evaluated in office.  Stop imipramine do not restart amitriptyline.

## 2023-03-21 ENCOUNTER — Encounter: Payer: Self-pay | Admitting: Family

## 2023-03-21 ENCOUNTER — Ambulatory Visit (INDEPENDENT_AMBULATORY_CARE_PROVIDER_SITE_OTHER): Payer: Self-pay | Admitting: Family

## 2023-03-21 VITALS — BP 112/76 | HR 79 | Temp 98.2°F | Ht 66.0 in | Wt 133.6 lb

## 2023-03-21 DIAGNOSIS — N3010 Interstitial cystitis (chronic) without hematuria: Secondary | ICD-10-CM

## 2023-03-21 DIAGNOSIS — L270 Generalized skin eruption due to drugs and medicaments taken internally: Secondary | ICD-10-CM | POA: Insufficient documentation

## 2023-03-21 DIAGNOSIS — D72829 Elevated white blood cell count, unspecified: Secondary | ICD-10-CM | POA: Insufficient documentation

## 2023-03-21 DIAGNOSIS — R12 Heartburn: Secondary | ICD-10-CM

## 2023-03-21 MED ORDER — TRIAMCINOLONE ACETONIDE 0.1 % EX CREA: 1.0000 | TOPICAL_CREAM | Freq: Two times a day (BID) | CUTANEOUS | 0 refills | Status: DC

## 2023-03-21 MED ORDER — PREGABALIN 75 MG PO CAPS
75.0000 mg | ORAL_CAPSULE | Freq: Two times a day (BID) | ORAL | 0 refills | Status: DC
Start: 2023-03-21 — End: 2023-05-03

## 2023-03-21 NOTE — Assessment & Plan Note (Signed)
Failure imipramine and amitriptyline Failure hydroxyxine Trial lyrica 75 mg nightly, increase to bid as tolerated.  If this fails to improve symptoms will need to refer to urology for eval/treat

## 2023-03-21 NOTE — Assessment & Plan Note (Signed)
Zyrtec 1-2 times daily Prednisone deferred due to intolerance.  Triamcinolone cream 0.1% cream for topical relief.

## 2023-03-21 NOTE — Assessment & Plan Note (Signed)
Suspected to be result of prednisone.  Will repeat in two weeks. Discussed w patient if development of fever and or rash worsens will repeat sooner.

## 2023-03-21 NOTE — Progress Notes (Signed)
Established Patient Office Visit  Subjective:   Patient ID: Susan Wade, female    DOB: 26-Sep-1980  Age: 42 y.o. MRN: [id number]  CC:  Chief Complaint  Patient presents with   Medical Management of Chronic Issues    HPI: [name] is a 42 y.o. female presenting on 03/21/2023 for Medical Management of Chronic Issues  IC: was initially treated with amitriptyline , started with a burning painful tongue so this was stopped, put on prednisone which she completed the course however it made her shaky, no real improvement in sensation. Then started on imipramine, and since (three days ago started) has itchy rash bil forearms. Has used benadryl cream with only mild relief. Slight improvement in burning sensation on tongue today. Does use hydroxyzine nightly with no improvement in IC symptoms. Still with pelvic pressure and at times with stabbing discomfort at urethral area. Also with frequent urgency and frequency, feels like she has to go constantly which is typical symptoms for her IC. Has never seen a urologist.         ROS: Negative unless specifically indicated above in HPI.   Relevant past medical history reviewed and updated as indicated.   Allergies and medications reviewed and updated.   Current Outpatient Medications:    ALPRAZolam (XANAX) 0.5 MG tablet, TAKE ONE TABLET BY MOUTH TWICE A DAY AS NEEDED FOR ANXIETY, Disp: 60 tablet, Rfl: 2   fluconazole (DIFLUCAN) 150 MG tablet, Take one po every day for one dose then repeat in three days if still with symptoms., Disp: 2 tablet, Rfl: 0   hydrOXYzine (VISTARIL) 25 MG capsule, Take 1-2 tablets po qhs prn anxiety and or sleep, Disp: 90 capsule, Rfl: 1   pregabalin (LYRICA) 75 MG capsule, Take 1 capsule (75 mg total) by mouth 2 (two) times daily., Disp: 60 capsule, Rfl: 0   sertraline (ZOLOFT) 100 MG tablet, Take two tablets once daily (total 200 mg), Disp: 180 tablet, Rfl: 0   triamcinolone cream (KENALOG) 0.1 %, Apply 1  Application topically 2 (two) times daily., Disp: 30 g, Rfl: 0  Allergies  Allergen Reactions   Amitriptyline    Buspirone Hives    Objective:   BP 112/76   Pulse 79   Temp 98.2 F (36.8 C) (Temporal)   Ht 5\' 6"  (1.676 m)   Wt 133 lb 9.6 oz (60.6 kg)   LMP 05/30/2019   SpO2 99%   BMI 21.56 kg/m    Physical Exam Constitutional:      General: She is not in acute distress.    Appearance: Normal appearance. She is normal weight. She is not ill-appearing, toxic-appearing or diaphoretic.  HENT:     Mouth/Throat:     Mouth: Mucous membranes are moist. Oral lesions (pin point raised red papular lesions tip of tongue) present.     Dentition: Normal dentition.  Cardiovascular:     Rate and Rhythm: Normal rate.  Pulmonary:     Effort: Pulmonary effort is normal.  Abdominal:     General: Abdomen is flat.     Tenderness: There is no abdominal tenderness (no pelvic tenderness). There is no right CVA tenderness or left CVA tenderness.  Skin:    Comments: Bil posterior forearms from base of wrist to mid forearm with papular raised rash   Neurological:     General: No focal deficit present.     Mental Status: She is alert and oriented to person, place, and time. Mental status is at baseline.  Motor: No weakness.  Psychiatric:        Mood and Affect: Mood normal.        Behavior: Behavior normal.        Thought Content: Thought content normal.        Judgment: Judgment normal.     Assessment & Plan:  Interstitial cystitis Assessment & Plan: Failure imipramine and amitriptyline Failure hydroxyxine Trial lyrica 75 mg nightly, increase to bid as tolerated.  If this fails to improve symptoms will need to refer to urology for eval/treat   Orders: -     Pregabalin; Take 1 capsule (75 mg total) by mouth 2 (two) times daily.  Dispense: 60 capsule; Refill: 0 -     Triamcinolone Acetonide; Apply 1 Application topically 2 (two) times daily.  Dispense: 30 g; Refill: 0  Allergic  drug rash Assessment & Plan: Zyrtec 1-2 times daily Prednisone deferred due to intolerance.  Triamcinolone cream 0.1% cream for topical relief.   Leukocytosis, unspecified type Assessment & Plan: Suspected to be result of prednisone.  Will repeat in two weeks. Discussed w patient if development of fever and or rash worsens will repeat sooner.  Orders: -     CBC with Differential/Platelet; Future     Follow up plan: Return if symptoms worsen or fail to improve.  Mort Sawyers, FNP

## 2023-03-27 NOTE — Telephone Encounter (Signed)
Hi Ladona Ridgel,   I am requesting a quick consult on this patient for your thoughts. She was given elavil for interstitial cystitis and not shortly after starting with burning tongue pain. She c/o tingling and numbness of lips and altered taste as well. Not thrush, not b12 def. We stopped elavil without any relief. She has been off of it for a few weeks and still with the sensation. She had a positive ANA' but it was only 1:40. She is self pay so this complicates things and I am trying to determine if I need ENT or rheum or something else entirely. She is needing to eat ice chips or ice cream to dull the sensation.   She has tried prednisone without relief as well bc we had initially thought allergic reaction.   Thank you for your time.    Regards,   Mort Sawyers FNP-C

## 2023-03-28 NOTE — Telephone Encounter (Signed)
Hi Tabitha,  Sorry for the delayed response-I reviewed the patients lab work.  If she develops oral ulcers or nasal ulcers we would be happy to see her to do further workup.   I agree with trying zinc and watching and waiting for now but please let us know if she develops any other symptoms of autoimmune disease.

## 2023-03-29 MED ORDER — PANTOPRAZOLE SODIUM 20 MG PO TBEC
20.0000 mg | DELAYED_RELEASE_TABLET | Freq: Every day | ORAL | 0 refills | Status: DC
Start: 2023-03-29 — End: 2023-05-31

## 2023-03-29 NOTE — Addendum Note (Signed)
Addended by: Mort Sawyers on: 03/29/2023 12:44 PM   Modules accepted: Orders

## 2023-03-29 NOTE — Telephone Encounter (Signed)
 Thanks so much Ladona Ridgel!

## 2023-04-03 ENCOUNTER — Other Ambulatory Visit (INDEPENDENT_AMBULATORY_CARE_PROVIDER_SITE_OTHER): Payer: Self-pay

## 2023-04-03 ENCOUNTER — Encounter: Payer: Self-pay | Admitting: Family

## 2023-04-03 DIAGNOSIS — D72829 Elevated white blood cell count, unspecified: Secondary | ICD-10-CM

## 2023-04-03 LAB — CBC WITH DIFFERENTIAL/PLATELET
Basophils Absolute: 0.1 10*3/uL (ref 0.0–0.1)
Basophils Relative: 0.9 % (ref 0.0–3.0)
Eosinophils Absolute: 0.2 10*3/uL (ref 0.0–0.7)
Eosinophils Relative: 2.1 % (ref 0.0–5.0)
HCT: 45.1 % (ref 36.0–46.0)
Hemoglobin: 14.8 g/dL (ref 12.0–15.0)
Lymphocytes Relative: 27.4 % (ref 12.0–46.0)
Lymphs Abs: 2 10*3/uL (ref 0.7–4.0)
MCHC: 32.9 g/dL (ref 30.0–36.0)
MCV: 91.2 fL (ref 78.0–100.0)
Monocytes Absolute: 0.5 10*3/uL (ref 0.1–1.0)
Monocytes Relative: 6.5 % (ref 3.0–12.0)
Neutro Abs: 4.6 10*3/uL (ref 1.4–7.7)
Neutrophils Relative %: 63.1 % (ref 43.0–77.0)
Platelets: 212 10*3/uL (ref 150.0–400.0)
RBC: 4.95 Mil/uL (ref 3.87–5.11)
RDW: 13.8 % (ref 11.5–15.5)
WBC: 7.3 10*3/uL (ref 4.0–10.5)

## 2023-04-14 ENCOUNTER — Telehealth: Payer: Self-pay | Admitting: Physician Assistant

## 2023-04-14 DIAGNOSIS — H60391 Other infective otitis externa, right ear: Secondary | ICD-10-CM

## 2023-04-14 MED ORDER — CIPROFLOXACIN-DEXAMETHASONE 0.3-0.1 % OT SUSP
4.0000 [drp] | Freq: Two times a day (BID) | OTIC | 0 refills | Status: DC
Start: 2023-04-14 — End: 2023-11-08

## 2023-04-14 NOTE — Progress Notes (Signed)
E Visit for Ear Pain - Otitis Externa  We are sorry that you are not feeling well. Here is how we plan to help!  Based on what you have shared with me it looks like you have Otitis Externa.  Otitis Externa is a redness or swelling, irritation, or infection of your outer ear canal. These symptoms usually occur within a few days of swimming. Your ear canal is a tube that goes from the opening of the ear to the eardrum.  When water stays in your ear canal, germs can grow.  This is a painful condition that often happens to children and swimmers of all ages.  It is not contagious and oral antibiotics are not required to treat uncomplicated otitis externa.  The usual symptoms include:    Itchiness inside the ear  Redness or a sense of swelling in the ear  Pain when the ear is tugged on when pressure is placed on the ear  Pus draining from the infected ear   I have prescribed: Ciprofloxacin 0.3% and dexamethasone 0.1% otic suspension four drops in affected ears two times a day for 7 days  In certain cases, swimmer's ear may progress to a more serious bacterial infection of the middle or inner ear.  If you have a fever 102 and up and significantly worsening symptoms, this could indicate a more serious infection moving to the middle/inner and needs face to face evaluation in an office by a provider.  Your symptoms should improve over the next 3 days and should resolve in about 7 days.  Be sure to complete ALL of your prescription.  HOME CARE: Wash your hands frequently. If you are prescribed an ear drop, do not place the tip of the bottle on your ear or touch it with your fingers. You can take Acetaminophen 650 mg every 4-6 hours as needed for pain.  If pain is severe or moderate, you can apply a heating pad (set on low) or hot water bottle (wrapped in a towel) to outer ear for 20 minutes.  This will also increase drainage. Avoid ear plugs Do not go swimming until the symptoms are gone Do not use  Q-tips After showers, help the water run out by tilting your head to one side.   GET HELP RIGHT AWAY IF: Fever is over 102.2 degrees. You develop progressive ear pain or hearing loss. Ear symptoms persist longer than 3 days after treatment.  MAKE SURE YOU: Understand these instructions. Will watch your condition. Will get help right away if you are not doing well or get worse.  TO PREVENT SWIMMER'S EAR: Use a bathing cap or custom fitted swim molds to keep your ears dry. Towel off after swimming to dry your ears. Tilt your head or pull your earlobes to allow the water to escape your ear canal. If there is still water in your ears, consider using a hairdryer on the lowest setting.  Thank you for choosing an e-visit.  Your e-visit answers were reviewed by a board certified advanced clinical practitioner to complete your personal care plan. Depending upon the condition, your plan could have included both over the counter or prescription medications.  Please review your pharmacy choice. Make sure the pharmacy is open so you can pick up the prescription now. If there is a problem, you may contact your provider through [employer] and have the prescription routed to another pharmacy.  Your safety is important to [place]. If you have drug allergies check your prescription carefully.  For the next 24 hours you can use MyChart to ask questions about today's visit, request a non-urgent call back, or ask for a work or school excuse. You will get an email with a survey after your eVisit asking about your experience. We would appreciate your feedback. I hope that your e-visit has been valuable and will aid in your recovery.   I have spent 5 minutes in review of e-visit questionnaire, review and updating patient chart, medical decision making and response to patient.   Margaretann Loveless, PA-C

## 2023-04-15 NOTE — Progress Notes (Signed)
e

## 2023-04-17 ENCOUNTER — Emergency Department
Admission: EM | Admit: 2023-04-17 | Discharge: 2023-04-17 | Disposition: A | Discharge status: Home / Self Care | Payer: Self-pay | Attending: Emergency Medicine | Admitting: Emergency Medicine

## 2023-04-17 ENCOUNTER — Other Ambulatory Visit: Payer: Self-pay

## 2023-04-17 DIAGNOSIS — D72829 Elevated white blood cell count, unspecified: Secondary | ICD-10-CM | POA: Insufficient documentation

## 2023-04-17 DIAGNOSIS — A46 Erysipelas: Secondary | ICD-10-CM | POA: Insufficient documentation

## 2023-04-17 LAB — CBC
HCT: 43.6 % (ref 36.0–46.0)
Hemoglobin: 14.8 g/dL (ref 12.0–15.0)
MCH: 29.9 pg (ref 26.0–34.0)
MCHC: 33.9 g/dL (ref 30.0–36.0)
MCV: 88.1 fL (ref 80.0–100.0)
Platelets: 187 10*3/uL (ref 150–400)
RBC: 4.95 MIL/uL (ref 3.87–5.11)
RDW: 13 % (ref 11.5–15.5)
WBC: 13.2 10*3/uL — ABNORMAL HIGH (ref 4.0–10.5)
nRBC: 0 % (ref 0.0–0.2)

## 2023-04-17 LAB — BASIC METABOLIC PANEL
Anion gap: 13 (ref 5–15)
BUN: 12 mg/dL (ref 6–20)
CO2: 24 mmol/L (ref 22–32)
Calcium: 8.8 mg/dL — ABNORMAL LOW (ref 8.9–10.3)
Chloride: 99 mmol/L (ref 98–111)
Creatinine, Ser: 0.57 mg/dL (ref 0.44–1.00)
GFR, Estimated: >60 mL/min (ref >60)
Glucose, Bld: 106 mg/dL — ABNORMAL HIGH (ref 70–99)
Potassium: 3.4 mmol/L — ABNORMAL LOW (ref 3.5–5.1)
Sodium: 136 mmol/L (ref 135–145)

## 2023-04-17 MED ORDER — BUTALBITAL-APAP-CAFFEINE 50-325-40 MG PO TABS
1.0000 | ORAL_TABLET | Freq: Four times a day (QID) | ORAL | 0 refills | Status: AC | PRN
Start: 2023-04-17 — End: 2024-04-16

## 2023-04-17 MED ORDER — CEPHALEXIN 500 MG PO CAPS: 500.0000 mg | ORAL_CAPSULE | Freq: Three times a day (TID) | ORAL | 0 refills | Status: AC

## 2023-04-17 MED ORDER — SULFAMETHOXAZOLE-TRIMETHOPRIM 800-160 MG PO TABS: 1.0000 | ORAL_TABLET | Freq: Two times a day (BID) | ORAL | 0 refills | Status: DC

## 2023-04-17 NOTE — ED Provider Notes (Addendum)
[facility] Provider Note    Event Date/Time   First MD Initiated Contact with Patient 04/17/23 [phone]     (approximate)   History   Otalgia   HPI  Susan Wade is a 42 y.o. female sent in for right ear swelling and concern for possible mastoiditis.  Per records patient was treated for otitis externa via video visit 3 days ago.  She states she went to urgent care yesterday and was given a shot of antibiotics and started on Augmentin.  She was told to come to the emergency department if any worsening because of possibility of mastoiditis.       Physical Exam   Triage Vital Signs: ED Triage Vitals  Encounter Vitals Group     BP 04/17/23 0912 115/80     Systolic BP Percentile --      Diastolic BP Percentile --      Pulse Rate 04/17/23 0912 98     Resp 04/17/23 0912 18     Temp 04/17/23 0912 100.3 F (37.9 C)     Temp src --      SpO2 04/17/23 0912 98 %     Weight 04/17/23 0910 61.2 kg (135 lb)     Height 04/17/23 0910 1.676 m (5\' 6" )     Head Circumference --      Peak Flow --      Pain Score 04/17/23 0910 10     Pain Loc --      Pain Education --      Exclude from Growth Chart --     Most recent vital signs: Vitals:   04/17/23 0912  BP: 115/80  Pulse: 98  Resp: 18  Temp: 100.3 F (37.9 C)  SpO2: 98%     General: Awake, no distress.  CV:  Good peripheral perfusion.  Resp:  Normal effort.  Abd:  No distention.  Other:  Swelling of the right auricle, no signs of swelling of the external acoustic meatus, TM looks normal.  Also has a raised red rash with well-defined border to the right face, not involving the orbit.  Suspicious for erysipelas versus cellulitis   ED Results / Procedures / Treatments   Labs (all labs ordered are listed, but only abnormal results are displayed) Labs Reviewed  CBC - Abnormal; Notable for the following components:      Result Value   WBC 13.2 (*)    All other components within normal limits  BASIC  METABOLIC PANEL - Abnormal; Notable for the following components:   Potassium 3.4 (*)    Glucose, Bld 106 (*)    Calcium 8.8 (*)    All other components within normal limits     EKG     RADIOLOGY     PROCEDURES:  Critical Care performed:   Procedures   MEDICATIONS ORDERED IN ED: Medications - No data to display   IMPRESSION / MDM / ASSESSMENT AND PLAN / ED COURSE  I reviewed the triage vital signs and the nursing notes. Patient's presentation is most consistent with acute presentation with potential threat to life or bodily function.  Patient presents with rash, auricle swelling as defined above.  She is not diabetic, no risk factors for malignant otitis externa.  Exam appears more consistent with erysipelas versus cellulitis.  Not consistent with mastoiditis which would require inner ear infection prolonged.  Mild elevation of white blood cell count, elevation in temperature noted consistent with bacterial infection.  Will treat with  Keflex, Bactrim, close follow-up, strict return precautions, no indication for admission at this time although the possibility for needing IV antibiotics does exist  Patient does complain of mild frontal headache bilaterally, not related to ear swelling      FINAL CLINICAL IMPRESSION(S) / ED DIAGNOSES   Final diagnoses:  Erysipelas     Rx / DC Orders   ED Discharge Orders          Ordered    cephALEXin (KEFLEX) 500 MG capsule  3 times daily        04/17/23 0954    sulfamethoxazole-trimethoprim (BACTRIM DS) 800-160 MG tablet  2 times daily        04/17/23 0955    butalbital-acetaminophen-caffeine (FIORICET) 50-325-40 MG tablet  Every 6 hours PRN        04/17/23 1000             Note:  This document was prepared using Dragon voice recognition software and may include unintentional dictation errors.   Jene Every, MD 04/17/23 1028    Jene Every, MD 04/17/23 [phone]

## 2023-04-17 NOTE — ED Notes (Signed)
Uc gave rocephin and put on augmentin.

## 2023-04-17 NOTE — ED Triage Notes (Signed)
Pt comes with c/o mastoiditis dx yesterday. Pt states right ear pain redness and swelling. Pt states she was started on meds and told to come to ED fir it got worse.

## 2023-04-19 ENCOUNTER — Encounter: Payer: Self-pay | Admitting: Family

## 2023-04-27 ENCOUNTER — Encounter: Payer: Self-pay | Admitting: Family

## 2023-04-27 ENCOUNTER — Telehealth: Payer: Self-pay | Admitting: Physician Assistant

## 2023-04-27 DIAGNOSIS — R197 Diarrhea, unspecified: Secondary | ICD-10-CM

## 2023-04-27 DIAGNOSIS — R112 Nausea with vomiting, unspecified: Secondary | ICD-10-CM

## 2023-04-27 NOTE — Telephone Encounter (Signed)
 Can she bring in a stool sample when she comes in to pick up the containers? [name], bring her sample and then she can put them into the containers herself? Just trying to save her a trip.

## 2023-04-27 NOTE — Addendum Note (Signed)
 Addended by: Lovena Neighbours on: 04/27/2023 12:23 PM   Modules accepted: Orders

## 2023-04-27 NOTE — Progress Notes (Signed)
 Because of recent antibiotic use and now having diarrhea and cramping, I feel your condition warrants further evaluation and I recommend that you be seen in a face to face visit. I am concerned you may have a complication from antibiotics called C. Difficile (this is a secondary infection of the gut from antibiotics). It takes obtaining a stool specimen to test for this. I feel you should be checked for this due to the history.   NOTE: There will be NO CHARGE for this eVisit   If you are having a true medical emergency please call 911.      For an urgent face to face visit, [place] has eight urgent care centers for your convenience:   NEW!! [facility] at [facility] Get Driving Directions [phone] [address], [address] [place], [address]    [facility] Health Urgent Care Center at [facility] Get Driving Directions [phone] [address] [place], [place] [address]   [facility] Health Urgent Care Center [facility]) Get Driving Directions [phone] [address] [place], [place] [address]  [facility] Health Urgent Care Center [facility]) Get Driving Directions [phone] [address] [place],  [place]  [address]  [facility] Health Urgent Care Center [facility] - at [employer]  [phone] [address] [place],  [place] [address]   [facility] Health Urgent Care at [facility] Get Driving Directions [phone] [address] [place], [place] [address]   [facility] Health Urgent Care at [facility] Get Driving Directions  [phone] [address] [place], [place] [address]   [facility] Health Urgent Care at [facility] Directions [phone] [address] [place], [place] [address]  Your MyChart E-visit questionnaire answers were reviewed by a board certified advanced clinical practitioner to complete your  personal care plan based on your specific symptoms.  Thank you for using e-Visits.     I have spent 5 minutes in review of e-visit questionnaire, review and updating patient chart, medical decision making and response to patient.   Delon CHRISTELLA Dickinson, PA-C

## 2023-04-28 MED ORDER — ONDANSETRON HCL 4 MG PO TABS: 4.0000 mg | ORAL_TABLET | Freq: Three times a day (TID) | ORAL | 0 refills | Status: DC | PRN

## 2023-04-28 NOTE — Addendum Note (Signed)
 Addended by: Mort Sawyers on: 04/28/2023 07:21 AM   Modules accepted: Orders

## 2023-04-29 ENCOUNTER — Emergency Department
Admission: EM | Admit: 2023-04-29 | Discharge: 2023-04-29 | Disposition: A | Discharge status: Home / Self Care | Payer: Self-pay | Attending: Emergency Medicine | Admitting: Emergency Medicine

## 2023-04-29 ENCOUNTER — Other Ambulatory Visit: Payer: Self-pay

## 2023-04-29 ENCOUNTER — Encounter: Payer: Self-pay | Admitting: Family

## 2023-04-29 ENCOUNTER — Emergency Department: Payer: Self-pay

## 2023-04-29 DIAGNOSIS — D72829 Elevated white blood cell count, unspecified: Secondary | ICD-10-CM | POA: Insufficient documentation

## 2023-04-29 DIAGNOSIS — A0472 Enterocolitis due to Clostridium difficile, not specified as recurrent: Secondary | ICD-10-CM | POA: Insufficient documentation

## 2023-04-29 LAB — COMPREHENSIVE METABOLIC PANEL
ALT: 10 U/L (ref 0–44)
AST: 18 U/L (ref 15–41)
Albumin: 4 g/dL (ref 3.5–5.0)
Alkaline Phosphatase: 58 U/L (ref 38–126)
Anion gap: 11 (ref 5–15)
BUN: 10 mg/dL (ref 6–20)
CO2: 20 mmol/L — ABNORMAL LOW (ref 22–32)
Calcium: 8.8 mg/dL — ABNORMAL LOW (ref 8.9–10.3)
Chloride: 105 mmol/L (ref 98–111)
Creatinine, Ser: 0.57 mg/dL (ref 0.44–1.00)
GFR, Estimated: >60 mL/min (ref >60)
Glucose, Bld: 93 mg/dL (ref 70–99)
Potassium: 3.3 mmol/L — ABNORMAL LOW (ref 3.5–5.1)
Sodium: 136 mmol/L (ref 135–145)
Total Bilirubin: 1 mg/dL (ref 0.0–1.2)
Total Protein: 7.7 g/dL (ref 6.5–8.1)

## 2023-04-29 LAB — CBC
HCT: 44 % (ref 36.0–46.0)
Hemoglobin: 14.5 g/dL (ref 12.0–15.0)
MCH: 29.7 pg (ref 26.0–34.0)
MCHC: 33 g/dL (ref 30.0–36.0)
MCV: 90.2 fL (ref 80.0–100.0)
Platelets: 216 10*3/uL (ref 150–400)
RBC: 4.88 MIL/uL (ref 3.87–5.11)
RDW: 12.8 % (ref 11.5–15.5)
WBC: 10.8 10*3/uL — ABNORMAL HIGH (ref 4.0–10.5)
nRBC: 0 % (ref 0.0–0.2)

## 2023-04-29 LAB — GASTROINTESTINAL PANEL BY PCR, STOOL (REPLACES STOOL CULTURE)

## 2023-04-29 LAB — C DIFFICILE QUICK SCREEN W PCR REFLEX
C Diff antigen: POSITIVE — AB
C Diff toxin: NEGATIVE

## 2023-04-29 LAB — LIPASE, BLOOD: Lipase: 29 U/L (ref 11–51)

## 2023-04-29 LAB — CLOSTRIDIUM DIFFICILE BY PCR, REFLEXED: Toxigenic C. Difficile by PCR: POSITIVE — AB

## 2023-04-29 MED ORDER — IOHEXOL 300 MG/ML  SOLN
100.0000 mL | Freq: Once | INTRAMUSCULAR | Status: AC | PRN
  Administered 2023-04-29: 100 mL via INTRAVENOUS

## 2023-04-29 MED ORDER — VANCOMYCIN HCL 125 MG PO CAPS: 125.0000 mg | ORAL_CAPSULE | Freq: Four times a day (QID) | ORAL | 0 refills | Status: AC

## 2023-04-29 NOTE — ED Triage Notes (Signed)
 Pt to Ed via POV from home. Pt reports was on antibiotic for skin infection and finished them on Saturday. Pt reports has now been having N/V/D and abd pain since. Pt has been in contact with PCP and suspects c/diff.

## 2023-04-29 NOTE — ED Provider Notes (Signed)
 [facility] Provider Note    None    (approximate)   History   Abdominal Pain   HPI  Susan Wade is a 43 y.o. female who presents today for evaluation of diarrhea and abdominal cramping.  Patient reports that she just finished antibiotics last weekend (Keflex  and Bactrim ) for a skin infection which has completely resolved, then her cramping abdominal pain and diarrhea began on Tuesday.  Patient reports that she has had multiple episodes of diarrhea per day, though less than 10.  She has had vomiting but no vomiting in 2 days.  She reports that she still feels nauseated.  No other sick contacts.  No fevers or chills.  She reports that her primary care provider was concerned about C. difficile so she sent her to the emergency department.  Patient Active Problem List   Diagnosis Date Noted   Allergic drug rash 03/21/2023   Leukocytosis 03/21/2023   Interstitial cystitis 03/09/2023   Dysgeusia 03/07/2023   Major depressive disorder with current active episode 12/02/2020   Generalized anxiety disorder 06/09/2020   Gastroesophageal reflux disease 02/17/2020   Scoliosis 03/21/2019   Family history of colon cancer in father 03/21/2019          Physical Exam   Triage Vital Signs: ED Triage Vitals  Encounter Vitals Group     BP 04/29/23 1159 (!) 143/80     Systolic BP Percentile --      Diastolic BP Percentile --      Pulse Rate 04/29/23 1159 89     Resp 04/29/23 1159 20     Temp 04/29/23 1159 98.6 F (37 C)     Temp Source 04/29/23 1159 Oral     SpO2 04/29/23 1159 98 %     Weight --      Height --      Head Circumference --      Peak Flow --      Pain Score 04/29/23 1201 6     Pain Loc --      Pain Education --      Exclude from Growth Chart --     Most recent vital signs: Vitals:   04/29/23 1159 04/29/23 1816  BP: (!) 143/80 113/79  Pulse: 89 87  Resp: 20 19  Temp: 98.6 F (37 C) 98.6 F (37 C)  SpO2: 98% 99%    Physical  Exam Vitals and nursing note reviewed.  Constitutional:      General: Awake and alert. No acute distress.    Appearance: Normal appearance. The patient is normal weight.  HENT:     Head: Normocephalic and atraumatic.     Mouth: Mucous membranes are moist.  Eyes:     General: PERRL. Normal EOMs        Right eye: No discharge.        Left eye: No discharge.     Conjunctiva/sclera: Conjunctivae normal.  Cardiovascular:     Rate and Rhythm: Normal rate and regular rhythm.     Pulses: Normal pulses.  Pulmonary:     Effort: Pulmonary effort is normal. No respiratory distress.     Breath sounds: Normal breath sounds.  Abdominal:     Abdomen is soft. There is no abdominal tenderness. No rebound or guarding. No distention. Musculoskeletal:        General: No swelling. Normal range of motion.     Cervical back: Normal range of motion and neck supple.  Skin:  General: Skin is warm and dry.     Capillary Refill: Capillary refill takes less than 2 seconds.     Findings: No rash.  Neurological:     Mental Status: The patient is awake and alert.      ED Results / Procedures / Treatments   Labs (all labs ordered are listed, but only abnormal results are displayed) Labs Reviewed  C DIFFICILE QUICK SCREEN W PCR REFLEX   - Abnormal; Notable for the following components:      Result Value   C Diff antigen POSITIVE (*)    All other components within normal limits  CLOSTRIDIUM DIFFICILE BY PCR, REFLEXED - Abnormal; Notable for the following components:   Toxigenic C. Difficile by PCR POSITIVE (*)    All other components within normal limits  COMPREHENSIVE METABOLIC PANEL - Abnormal; Notable for the following components:   Potassium 3.3 (*)    CO2 20 (*)    Calcium 8.8 (*)    All other components within normal limits  CBC - Abnormal; Notable for the following components:   WBC 10.8 (*)    All other components within normal limits  GASTROINTESTINAL PANEL BY PCR, STOOL (REPLACES STOOL  CULTURE)  LIPASE, BLOOD  URINALYSIS, ROUTINE W REFLEX MICROSCOPIC     EKG     RADIOLOGY I independently reviewed and interpreted imaging and agree with radiologists findings.     PROCEDURES:  Critical Care performed:   Procedures   MEDICATIONS ORDERED IN ED: Medications  iohexol  (OMNIPAQUE ) 300 MG/ML solution 100 mL (100 mLs Intravenous Contrast Given 04/29/23 1545)     IMPRESSION / MDM / ASSESSMENT AND PLAN / ED COURSE  I reviewed the triage vital signs and the nursing notes.   Differential diagnosis includes, but is not limited to, gastroenteritis, C. difficile, norovirus, dehydration, electrolyte disarray, diverticulitis.  Patient is awake and alert, hemodynamically stable and afebrile.  She is nontoxic in appearance.  I reviewed the patient's chart.  Patient was seen on 04/17/2023 and diagnosed with erysipelas.  She has been in communication with her primary care provider since that time.  Further workup is indicated.  Labs obtained in triage were overall reassuring, mild leukocytosis to 10.8.  Stool sample was collected and sent as well, patient has a positive C. difficile antigen but negative toxin, this is reflexed to PCR.  CT scan is negative for any acute findings.  No evidence of toxic megacolon, and patient is overall quite well in appearance.  PCR was positive.  I discussed with pharmacist on-call, Alan who reports that first-line is vancomycin  125 mg every 6 hours for 10 days.  I discussed this result with the patient.  We discussed strict return precautions and the importance of close outpatient follow-up.  She was also given the information for gastroenterology.  Discussed worsening symptoms that would warrant to return to the emergency department.  Patient and her husband understand and agree with plan.  She was discharged in stable condition.   Patient's presentation is most consistent with acute complicated illness / injury requiring diagnostic  workup.  Clinical Course as of 04/29/23 1855  Sat Apr 29, 2023  1756 Toxigenic C. Difficile by PCR(!): POSITIVE Discussed with pharmacist Alan, vancomycin  vs fidaxomicin. Alan will look into it and let me know [JP]  1815 Vancomycin  125mg  q6 hours x10 days per Alan pharmacist [JP]    Clinical Course User Index [JP] Henning Ehle E, PA-C     FINAL CLINICAL IMPRESSION(S) / ED DIAGNOSES  Final diagnoses:  C. difficile colitis     Rx / DC Orders   ED Discharge Orders          Ordered    vancomycin  (VANCOCIN ) 125 MG capsule  4 times daily        04/29/23 1807             Note:  This document was prepared using Dragon voice recognition software and may include unintentional dictation errors.   Fatou Dunnigan E, PA-C 04/29/23 DANIAL Jacolyn Pae, MD 04/29/23 [phone]

## 2023-04-29 NOTE — Discharge Instructions (Addendum)
 You c dif was positive.Take the antibiotics as prescribed. Please follow up with gastroenterology. Please return for new, worsening, or change in symptoms or other concerns. It was a pleasure caring for you today.

## 2023-05-03 ENCOUNTER — Encounter: Payer: Self-pay | Admitting: Family

## 2023-05-03 ENCOUNTER — Telehealth: Payer: Self-pay

## 2023-05-03 DIAGNOSIS — N3010 Interstitial cystitis (chronic) without hematuria: Secondary | ICD-10-CM

## 2023-05-03 NOTE — Transitions of Care (Post Inpatient/ED Visit) (Signed)
seen [facility] ED on 04/29/23 with diarrhea and stomach cramps; had blood with diarrhea. pt is feeling better no diarrhea or blood but still intermittent slight stomach cramps. pt still taking vancomycin 125 mg qid. Since pt is feeling better and taking abx pt will wait to schedule FU appt with PCP and GI. UC & ED precautions given and pt voiced understanding, pt will cb for appt if needed. Sending note to Hayden Pedro FNP.          05/03/2023  Name: Bao S Ratcliffe MRN: [id number] DOB: Taijah 18, 1982  Today's TOC FU Call Status: Today's TOC FU Call Status:: Successful TOC FU Call Completed TOC FU Call Complete Date: 05/03/23 Patient's Name and Date of Birth confirmed.  Transition Care Management Follow-up Telephone Call Date of Discharge: 04/29/23 Discharge Facility: [facility]) Type of Discharge: Emergency Department Reason for ED Visit: Other: (seen [facility] ED on01/11/25 with diarrhea and stomach cramps; had blood with diarrhea.  pt is feeling better no diarrhea but still intermitted slight stomach cramps. pt still taking vancomycin 125 mg qid.) How have you been since you were released from the hospital?: Better Any questions or concerns?: No  Items Reviewed: Did you receive and understand the discharge instructions provided?: Yes Medications obtained,verified, and reconciled?: Partial Review Completed Reason for Partial Mediation Review: reviewed med given at ED discharge; pt still taking vancomycin 125 mg qid Any new allergies since your discharge?: No Dietary orders reviewed?: NA Do you have support at home?: Yes People in Home: spouse Name of Support/Comfort Primary Source: Apolinar Junes  Medications Reviewed Today: Medications Reviewed Today   Medications were not reviewed in this encounter     Home Care and Equipment/Supplies: Were Home Health Services Ordered?: NA Any new equipment or medical supplies ordered?: NA  Functional Questionnaire: Do you need  assistance with bathing/showering or dressing?: No Do you need assistance with meal preparation?: No Do you need assistance with eating?: No Do you have difficulty maintaining continence: No Do you need assistance with getting out of bed/getting out of a chair/moving?: No Do you have difficulty managing or taking your medications?: No  Follow up appointments reviewed: PCP Follow-up appointment confirmed?: NA Specialist Hospital Follow-up appointment confirmed?: NA Do you need transportation to your follow-up appointment?: No Do you understand care options if your condition(s) worsen?: Yes-patient verbalized understanding    SIGNATURE Lewanda Rife, LPN

## 2023-05-04 MED ORDER — PREGABALIN 75 MG PO CAPS: 75.0000 mg | ORAL_CAPSULE | Freq: Two times a day (BID) | ORAL | 0 refills | Status: DC

## 2023-05-04 NOTE — Telephone Encounter (Signed)
Noted  

## 2023-05-05 ENCOUNTER — Encounter: Payer: Self-pay | Admitting: Family

## 2023-05-18 ENCOUNTER — Other Ambulatory Visit: Payer: Self-pay | Admitting: Family

## 2023-05-18 DIAGNOSIS — F419 Anxiety disorder, unspecified: Secondary | ICD-10-CM

## 2023-05-30 ENCOUNTER — Encounter: Payer: Self-pay | Admitting: Family

## 2023-05-30 DIAGNOSIS — K219 Gastro-esophageal reflux disease without esophagitis: Secondary | ICD-10-CM

## 2023-05-30 DIAGNOSIS — R1011 Right upper quadrant pain: Secondary | ICD-10-CM

## 2023-05-30 NOTE — Telephone Encounter (Signed)
You will need to see in office right?

## 2023-05-31 ENCOUNTER — Encounter: Payer: Self-pay | Admitting: Family

## 2023-05-31 MED ORDER — PANTOPRAZOLE SODIUM 40 MG PO TBEC: 40.0000 mg | DELAYED_RELEASE_TABLET | Freq: Every day | ORAL | 0 refills | Status: DC

## 2023-05-31 NOTE — Addendum Note (Signed)
Addended by: Mort Sawyers on: 05/31/2023 03:38 PM   Modules accepted: Orders

## 2023-06-09 ENCOUNTER — Encounter: Payer: Self-pay | Admitting: *Deleted

## 2023-06-09 ENCOUNTER — Encounter: Payer: Self-pay | Admitting: Family

## 2023-06-09 NOTE — Addendum Note (Signed)
Addended by: Mort Sawyers on: 06/09/2023 03:31 PM   Modules accepted: Orders

## 2023-06-13 ENCOUNTER — Ambulatory Visit
Admission: RE | Admit: 2023-06-13 | Discharge: 2023-06-13 | Disposition: A | Discharge status: Home / Self Care | Payer: Self-pay | Source: Ambulatory Visit | Attending: Family | Admitting: Family

## 2023-06-13 ENCOUNTER — Encounter: Payer: Self-pay | Admitting: Family

## 2023-06-13 ENCOUNTER — Other Ambulatory Visit: Payer: Self-pay | Admitting: Family

## 2023-06-13 DIAGNOSIS — R1011 Right upper quadrant pain: Secondary | ICD-10-CM | POA: Insufficient documentation

## 2023-06-13 DIAGNOSIS — R110 Nausea: Secondary | ICD-10-CM

## 2023-06-13 DIAGNOSIS — K219 Gastro-esophageal reflux disease without esophagitis: Secondary | ICD-10-CM

## 2023-06-13 DIAGNOSIS — K7689 Other specified diseases of liver: Secondary | ICD-10-CM | POA: Insufficient documentation

## 2023-06-15 ENCOUNTER — Other Ambulatory Visit: Payer: Self-pay | Admitting: Family

## 2023-06-15 DIAGNOSIS — R112 Nausea with vomiting, unspecified: Secondary | ICD-10-CM

## 2023-06-15 MED ORDER — OMEPRAZOLE 40 MG PO CPDR: 40.0000 mg | DELAYED_RELEASE_CAPSULE | Freq: Every day | ORAL | 3 refills | Status: DC

## 2023-06-15 NOTE — Telephone Encounter (Signed)
 Can we make sure referral to GI is not with Dr. Servando Snare?

## 2023-06-22 ENCOUNTER — Telehealth: Payer: Self-pay | Admitting: Family Medicine

## 2023-06-22 DIAGNOSIS — H9202 Otalgia, left ear: Secondary | ICD-10-CM

## 2023-06-22 NOTE — Progress Notes (Signed)
  Because you might have an ruptured ear drum, we need to have you seen in person- so someone can look into your ear, prior to anything being ordered to that we treat you safely. Your condition warrants further evaluation and I recommend that you be seen in a face-to-face visit.   NOTE: There will be NO CHARGE for this E-Visit   If you are having a true medical emergency, please call 911.

## 2023-06-28 ENCOUNTER — Other Ambulatory Visit: Payer: Self-pay | Admitting: Family Medicine

## 2023-06-28 ENCOUNTER — Encounter: Payer: Self-pay | Admitting: Family

## 2023-06-28 DIAGNOSIS — N3010 Interstitial cystitis (chronic) without hematuria: Secondary | ICD-10-CM

## 2023-08-11 ENCOUNTER — Other Ambulatory Visit: Payer: Self-pay | Admitting: Family

## 2023-08-11 ENCOUNTER — Encounter: Payer: Self-pay | Admitting: Family

## 2023-08-11 DIAGNOSIS — N3010 Interstitial cystitis (chronic) without hematuria: Secondary | ICD-10-CM

## 2023-09-07 ENCOUNTER — Other Ambulatory Visit: Payer: Self-pay | Admitting: Family

## 2023-09-07 DIAGNOSIS — F419 Anxiety disorder, unspecified: Secondary | ICD-10-CM

## 2023-09-08 ENCOUNTER — Encounter: Payer: Self-pay | Admitting: Family

## 2023-09-14 ENCOUNTER — Other Ambulatory Visit: Payer: Self-pay | Admitting: Family

## 2023-09-18 ENCOUNTER — Encounter: Payer: Self-pay | Admitting: Family

## 2023-09-18 DIAGNOSIS — F411 Generalized anxiety disorder: Secondary | ICD-10-CM

## 2023-09-19 ENCOUNTER — Other Ambulatory Visit: Payer: Self-pay | Admitting: Family Medicine

## 2023-09-19 DIAGNOSIS — N3010 Interstitial cystitis (chronic) without hematuria: Secondary | ICD-10-CM

## 2023-09-19 MED ORDER — CITALOPRAM HYDROBROMIDE 10 MG PO TABS
10.0000 mg | ORAL_TABLET | Freq: Every day | ORAL | 3 refills | Status: DC
Start: 2023-09-19 — End: 2023-12-26

## 2023-10-25 ENCOUNTER — Other Ambulatory Visit: Payer: Self-pay | Admitting: Family

## 2023-10-25 DIAGNOSIS — K219 Gastro-esophageal reflux disease without esophagitis: Secondary | ICD-10-CM

## 2023-10-31 ENCOUNTER — Telehealth: Payer: Self-pay | Admitting: Physician Assistant

## 2023-10-31 DIAGNOSIS — T753XXA Motion sickness, initial encounter: Secondary | ICD-10-CM

## 2023-10-31 MED ORDER — ONDANSETRON 4 MG PO TBDP: 4.0000 mg | ORAL_TABLET | Freq: Three times a day (TID) | ORAL | 0 refills | Status: DC | PRN

## 2023-10-31 NOTE — Progress Notes (Signed)
 E-Visit for Motion Sickness  We are sorry that you are not feeling well. Here is how we plan to help!  Based on what you have shared with me it looks like you have symptoms of motion sickness.  I have prescribed a medication that will help prevent or alleviate your symptoms: Zofran  ODT to take as directed, when needed for flight associated nausea.   Prevention:  You might feel better if you keep your eyes focused on outside while you are in motion. For example, if you are in a car, sit in the front and look in the direction you are moving; if you are on a boat, stay on the deck and look to the horizon. This helps make what you see match the movement you are feeling, and so you are less likely to feel sick.  You should also avoid reading, watching a movie, texting or reading messages, or looking at things close to you inside the vehicle you are riding in.  Use the seat head rest. Lean your head against the back of the seat or head rest when traveling in vehicles with seats to minimize head movements.  On a ship: When making your reservations, choose a cabin in the middle of the ship and near the waterline. When on board, go up on deck and focus on the horizon.  In an airplane: Request a window seat and look out the window. A seat over the front edge of the wing is the most preferable spot (the degree of motion is the lowest here). Direct the air vent to blow cool air on your face.  On a train: Always face forward and sit near a window.  In a vehicle: Sit in the front seat; if you are the passenger, look at the scenery in the distance. For some people, driving the vehicle (rather than being a passenger) is an instant remedy.  Avoid others who have become nauseous with motion sickness. Seeing and smelling others who have motion sickness may cause you to become sick.  GET HELP RIGHT AWAY IF:  Your symptoms do not improve or worsen within 2 days after treatment.  You cannot keep down fluids  after trying the medication.  Other associated symptoms such as severe headache, visual field changes, fever, or intractable nausea and vomiting.  MAKE SURE YOU:  Understand these instructions. Will watch your condition. Will get help right away if you are not doing well or get worse.  Thank you for choosing an e-visit.  Your e-visit answers were reviewed by a board certified advanced clinical practitioner to complete your personal care plan. Depending upon the condition, your plan could have included both over the counter or prescription medications.  Please review your pharmacy choice. Be sure that the pharmacy you have chosen is open so that you can pick up your prescription now.  If there is a problem you may message your provider in MyChart to have the prescription routed to another pharmacy.  Your safety is important to us . If you have drug allergies check your prescription carefully.   For the next 24 hours, you can use MyChart to ask questions about today's visit, request a non-urgent call back, or ask for a work or school excuse from your e-visit provider.  You will get an e-mail in the next two days asking about your experience. I hope that your e-visit has been valuable and will speed your recovery.   References or for more information: https://cross.com/ https://my.https://rowe.info/ https://www.uptodate.com

## 2023-10-31 NOTE — Progress Notes (Signed)
 I have spent 5 minutes in review of e-visit questionnaire, review and updating patient chart, medical decision making and response to patient.   Piedad Climes, PA-C

## 2023-11-06 ENCOUNTER — Encounter: Payer: Self-pay | Admitting: Family

## 2023-11-06 DIAGNOSIS — T753XXA Motion sickness, initial encounter: Secondary | ICD-10-CM

## 2023-11-06 DIAGNOSIS — K219 Gastro-esophageal reflux disease without esophagitis: Secondary | ICD-10-CM

## 2023-11-07 ENCOUNTER — Ambulatory Visit: Payer: Self-pay | Admitting: Family

## 2023-11-08 ENCOUNTER — Encounter: Payer: Self-pay | Admitting: Primary Care

## 2023-11-08 ENCOUNTER — Ambulatory Visit (INDEPENDENT_AMBULATORY_CARE_PROVIDER_SITE_OTHER): Payer: Self-pay | Admitting: Primary Care

## 2023-11-08 VITALS — BP 136/82 | HR 67 | Temp 98.1°F | Ht 66.0 in | Wt 139.0 lb

## 2023-11-08 DIAGNOSIS — R110 Nausea: Secondary | ICD-10-CM | POA: Insufficient documentation

## 2023-11-08 DIAGNOSIS — H938X3 Other specified disorders of ear, bilateral: Secondary | ICD-10-CM

## 2023-11-08 LAB — CBC WITH DIFFERENTIAL/PLATELET
Basophils Absolute: 0.1 K/uL (ref 0.0–0.1)
Basophils Relative: 0.7 % (ref 0.0–3.0)
Eosinophils Absolute: 0.1 K/uL (ref 0.0–0.7)
Eosinophils Relative: 0.9 % (ref 0.0–5.0)
HCT: 44.4 % (ref 36.0–46.0)
Hemoglobin: 14.6 g/dL (ref 12.0–15.0)
Lymphocytes Relative: 19 % (ref 12.0–46.0)
Lymphs Abs: 1.9 K/uL (ref 0.7–4.0)
MCHC: 32.9 g/dL (ref 30.0–36.0)
MCV: 87.3 fl (ref 78.0–100.0)
Monocytes Absolute: 0.5 K/uL (ref 0.1–1.0)
Monocytes Relative: 5.2 % (ref 3.0–12.0)
Neutro Abs: 7.2 K/uL (ref 1.4–7.7)
Neutrophils Relative %: 74.2 % (ref 43.0–77.0)
Platelets: 192 K/uL (ref 150.0–400.0)
RBC: 5.08 Mil/uL (ref 3.87–5.11)
RDW: 13.7 % (ref 11.5–15.5)
WBC: 9.8 K/uL (ref 4.0–10.5)

## 2023-11-08 LAB — COMPREHENSIVE METABOLIC PANEL WITH GFR
ALT: 9 U/L (ref 0–35)
AST: 12 U/L (ref 0–37)
Albumin: 4.8 g/dL (ref 3.5–5.2)
Alkaline Phosphatase: 50 U/L (ref 39–117)
BUN: 8 mg/dL (ref 6–23)
CO2: 28 meq/L (ref 19–32)
Calcium: 9.5 mg/dL (ref 8.4–10.5)
Chloride: 104 meq/L (ref 96–112)
Creatinine, Ser: 0.67 mg/dL (ref 0.40–1.20)
GFR: 108.23 mL/min (ref >60.00)
Glucose, Bld: 83 mg/dL (ref 70–99)
Potassium: 3.9 meq/L (ref 3.5–5.1)
Sodium: 139 meq/L (ref 135–145)
Total Bilirubin: 0.7 mg/dL (ref 0.2–1.2)
Total Protein: 7.5 g/dL (ref 6.0–8.3)

## 2023-11-08 LAB — MONONUCLEOSIS SCREEN: Mono Screen: NEGATIVE

## 2023-11-08 MED ORDER — PANTOPRAZOLE SODIUM 40 MG PO TBEC: 40.0000 mg | DELAYED_RELEASE_TABLET | Freq: Every day | ORAL | 3 refills | Status: DC

## 2023-11-08 MED ORDER — FAMOTIDINE 20 MG PO TABS: 20.0000 mg | ORAL_TABLET | Freq: Every day | ORAL | 0 refills | Status: DC

## 2023-11-08 MED ORDER — FLUTICASONE PROPIONATE 50 MCG/ACT NA SUSP: 1.0000 | Freq: Two times a day (BID) | NASAL | 0 refills | Status: AC

## 2023-11-08 NOTE — Assessment & Plan Note (Signed)
 Mild effusion noted on exam without infection to right ear.  Discussed use of Flonase  nasal spray twice daily as needed. Follow-up as needed.

## 2023-11-08 NOTE — Progress Notes (Signed)
 Subjective:    Patient ID: Susan Wade, female    DOB: January 21, 1981, 43 y.o.   MRN: [id number]  HPI  Lizanne S Smaltz is a very pleasant 43 y.o. female patient of Tabitha, NP with a history of GERD, interstitial cystitis, c-diff infection, GAD IBS, who presents today to discuss nausea and ear fullness.  Her nausea began about 2 weeks ago. Also with symptoms of fatigue with reduced appetite. She wakes each morning with throat burning/fullness, nausea, and a sensation of something stuck in her throat.   She's also noticed ear fullness, feels like there is fluid in the ears, particularly the right ear. Last night she noticed drainage on her pillow.   She is currently managed on omeprazole  40 mg daily for GERD, takes this in the morning on an empty stomach. She has an appointment with GI scheduled for 01/08/24. She was evaluated through an E-Visit on 10/31/23, was prescribed Zofran  ODT for nausea PRN for air flights, this helps some, also takes Dramamine. Last night she felt dizzy while in bed, isn't sure if this was from her Lyrica . She denies room spinning sensations.  He denies postnasal drip, fevers, cough, vomiting.  Her bowel movements are unchanged from usual.  Review of Systems  Constitutional:  Positive for fatigue. Negative for chills and fever.  HENT:  Positive for sore throat. Negative for congestion, ear pain and postnasal drip.   Gastrointestinal:  Positive for nausea. Negative for abdominal pain and vomiting.         Past Medical History:  Diagnosis Date   Scoliosis     Social History   Socioeconomic History   Marital status: Married    Spouse name: Penne   Number of children: 2   Years of education: high school   Highest education level: Not on file  Occupational History   Occupation: receptionist  Tobacco Use   Smoking status: Every Day    Current packs/day: 0.50    Average packs/day: 0.5 packs/day for 17.0 years (8.5 ttl pk-yrs)    Types: Cigarettes    Smokeless tobacco: Never  Vaping Use   Vaping status: Never Used  Substance and Sexual Activity   Alcohol use: Yes    Comment: glass of wine a few nights a week   Drug use: Never   Sexual activity: Yes    Partners: Male    Birth control/protection: Surgical  Other Topics Concern   Not on file  Social History Narrative   03/21/19   From: the area   Living: with husband and children   Work: Scientist, physiological      Family: [place] and Museum/gallery exhibitions officer (2011 and 2006)      Enjoys: hiking, go to [employer] (family in [place])      Exercise: going to the gym - walking the treadmill, weight lifting   Diet: fruits, veggies, fish/chicken, some red meats      Safety   Seat belts: Yes    Guns: No   Safe in relationships: Yes    Social Drivers of Corporate investment banker Strain: Low Risk  (03/21/2019)   Overall Financial Resource Strain (CARDIA)    Difficulty of Paying Living Expenses: Not hard at all  Food Insecurity: Not on file  Transportation Needs: Not on file  Physical Activity: Not on file  Stress: Not on file  Social Connections: Not on file  Intimate Partner Violence: Not on file    Past Surgical History:  Procedure Laterality Date  COLONOSCOPY     CYSTOSCOPY N/A 05/30/2019   Procedure: CYSTOSCOPY;  Surgeon: Lake Read, MD;  Location: [facility] ORS;  Service: Gynecology;  Laterality: N/A;   TOTAL LAPAROSCOPIC HYSTERECTOMY WITH SALPINGECTOMY N/A 05/30/2019   Procedure: TOTAL LAPAROSCOPIC HYSTERECTOMY WITH BILATERAL SALPINGECTOMY;  Surgeon: Lake Read, MD;  Location: [facility] ORS;  Service: Gynecology;  Laterality: N/A;   TUBAL LIGATION  2011   UPPER GI ENDOSCOPY      Family History  Problem Relation Age of Onset   Hypertension Mother    Hyperlipidemia Mother    Breast cancer Mother 66   Colon cancer Father 49   Rectal cancer Father    Healthy Sister    Colon polyps Sister 15   Breast cancer Maternal Grandmother 54   Hyperlipidemia Maternal Grandfather     Hypertension Maternal Grandfather    Diabetes Paternal Grandmother    Heart disease Paternal Grandmother    Hyperlipidemia Paternal Grandmother    Hypertension Paternal Grandmother    Colon cancer Paternal Grandfather    Colon cancer Cousin 35   Colon cancer Cousin 43   Breast cancer Other 65    Allergies  Allergen Reactions   Amitriptyline     Buspirone  Hives    Current Outpatient Medications on File Prior to Visit  Medication Sig Dispense Refill   ALPRAZolam  (XANAX ) 0.5 MG tablet TAKE ONE TABLET BY MOUTH TWICE A DAY AS NEEDED FOR ANXIETY 60 tablet 2   butalbital -acetaminophen -caffeine  (FIORICET) 50-325-40 MG tablet Take 1-2 tablets by mouth every 6 (six) hours as needed for headache. 20 tablet 0   citalopram  (CELEXA ) 10 MG tablet Take 1 tablet (10 mg total) by mouth daily. 30 tablet 3   omeprazole  (PRILOSEC) 40 MG capsule TAKE ONE CAPSULE BY MOUTH ONE TIME DAILY 30 capsule 3   ondansetron  (ZOFRAN -ODT) 4 MG disintegrating tablet Take 1 tablet (4 mg total) by mouth every 8 (eight) hours as needed for nausea or vomiting. 20 tablet 0   pregabalin  (LYRICA ) 75 MG capsule TAKE ONE CAPSULE BY MOUTH TWICE A DAY 60 capsule 2   triamcinolone  cream (KENALOG ) 0.1 % Apply 1 Application topically 2 (two) times daily. 30 g 0   hydrOXYzine  (VISTARIL ) 25 MG capsule Take 1-2 tablets po qhs prn anxiety and or sleep (Patient not taking: Reported on 11/08/2023) 90 capsule 1   No current facility-administered medications on file prior to visit.    BP 136/82   Pulse 67   Temp 98.1 F (36.7 C) (Temporal)   Ht 5' 6 (1.676 m)   Wt 139 lb (63 kg)   LMP 05/30/2019   SpO2 100%   BMI 22.44 kg/m  Objective:   Physical Exam HENT:     Right Ear: Ear canal normal. A middle ear effusion is present. There is no impacted cerumen.     Left Ear: Tympanic membrane and ear canal normal. There is no impacted cerumen.  Cardiovascular:     Rate and Rhythm: Normal rate and regular rhythm.  Pulmonary:     Effort:  Pulmonary effort is normal.     Breath sounds: Normal breath sounds.  Abdominal:     General: Bowel sounds are normal.     Palpations: Abdomen is soft.     Tenderness: There is no abdominal tenderness.           Assessment & Plan:  Nausea Assessment & Plan: Unclear etiology, but differential it makes most sense would be GERD.  Especially given her morning symptoms.  We discussed to move  omeprazole  in the evening with a meal for best effects. Add famotidine  20 mg in the a.m.SABRA  Checking labs today including CBC with differential, CMP, mononucleosis testing.  Continue Zofran  OTD PRN. Await results.    Orders: -     CBC with Differential/Platelet -     Comprehensive metabolic panel with GFR -     Mononucleosis screen -     Famotidine ; Take 1 tablet (20 mg total) by mouth daily. For heartburn  Dispense: 30 tablet; Refill: 0  Ear fullness, bilateral Assessment & Plan: Mild effusion noted on exam without infection to right ear.  Discussed use of Flonase  nasal spray twice daily as needed. Follow-up as needed.  Orders: -     Fluticasone  Propionate; Place 1 spray into both nostrils 2 (two) times daily.  Dispense: 16 g; Refill: 0        Comer MARLA Gaskins, NP

## 2023-11-08 NOTE — Assessment & Plan Note (Signed)
 Unclear etiology, but differential it makes most sense would be GERD.  Especially given her morning symptoms.  We discussed to move omeprazole  in the evening with a meal for best effects. Add famotidine  20 mg in the a.m.SABRA  Checking labs today including CBC with differential, CMP, mononucleosis testing.  Continue Zofran  OTD PRN. Await results.

## 2023-11-08 NOTE — Patient Instructions (Signed)
 Start taking omeprazole  40 mg pills in the evening with a meal.  Start famotidine  20 mg every morning.  Stop by the lab prior to leaving today. I will notify you of your results once received.   Avoid laying flat within 2 hours of eating.  Also avoid trigger foods for esophageal reflux.  It was a pleasure meeting you!   GERD in Adults: Diet Changes When you have gastroesophageal reflux disease (GERD), you may need to make changes to your diet. Choosing the right foods can help with your symptoms. Think about working with an expert in healthy eating called a dietitian. They can help you make healthy food choices. What are tips for following this plan? Reading food labels Look for foods that are low in saturated fat. Foods that may help with your symptoms include: Foods with less than 5% of daily value (DV) of fat. Foods with 0 grams of trans fat. Cooking [employer] in ways that don't use a lot of fat. These ways include: Baking. Steaming. Grilling. Broiling. To add flavor, try to use herbs that are low in spice and acidity. Avoid frying your food. Meal planning  Eat small meals often rather than eating 3 large meals each day. Eat your meals slowly in a place where you feel relaxed. If told by your health care provider, avoid: Foods that cause symptoms. Keep a food diary to keep track of foods that cause symptoms. Alcohol. Drinking a lot of liquid with meals. General instructions For 2-3 hours after you eat, avoid: Bending over. Exercise. Lying down. Chew sugar-free gum after meals. What foods should I eat? Eat a healthy diet. Try to include: Foods with high amounts of fiber. These include: Fruits and vegetables. Whole grains and beans. Low-fat dairy products. Lean meats, fish, and poultry. Egg whites. Foods that cause symptoms in someone else may not cause symptoms for you. Work with your provider to find foods that are safe for you. The items listed above may not  be all the foods and drinks you can have. Talk with a dietitian to learn more. The items listed above may not be a complete list of foods and beverages you can eat and drink. Contact a dietitian for more information. What foods should I avoid? Limiting some of these foods may help with your symptoms. Each person is different. Talk with a dietitian or your provider to help you find the exact foods to avoid. Some of the foods to avoid may include: Fruits Fruits with a lot of acid in them. These may include citrus fruits, such as oranges, grapefruit, pineapple, and lemons. Vegetables Deep-fried vegetables, such as [place] fries. Vegetables, sauces, or toppings made with added fat and vegetables with acid in them. These may include tomatoes and tomato products, chili peppers, onions, garlic, and horseradish. Grains Pastries or quick breads with added fat. Meats and other proteins High-fat meats, such as fatty beef or pork, hot dogs, ribs, ham, sausage, salami, and bacon. Fried meat or protein, such as fried fish and fried chicken. Egg yolks. Fats and oils Butter. Margarine. Shortening. Ghee. Drinks Coffee and other drinks with caffeine  in them. Fizzy and sugary drinks, such as soda and energy drinks. Fruit juice made with acidic fruits, such as orange or grapefruit. Tomato juice. Sweets and desserts Chocolate and cocoa. Donuts. Seasonings and condiments Mint, such as peppermint and spearmint. Condiments, herbs, or seasonings that cause symptoms. These may include curry, hot sauce, or vinegar-based salad dressings. The items listed above may  not be all the foods and drinks you should avoid. Talk with a dietitian to learn more. Questions to ask your health care provider Changes to your diet and everyday life are often the first steps taken to manage symptoms of GERD. If these changes don't help, talk with your provider about taking medicines. Where to find more information International Foundation  for Gastrointestinal Disorders: aboutgerd.org This information is not intended to replace advice given to you by your health care provider. Make sure you discuss any questions you have with your health care provider. Document Revised: 02/14/2023 Document Reviewed: 08/31/2022 Elsevier Patient Education  2024 [employer].

## 2023-11-09 ENCOUNTER — Encounter: Payer: Self-pay | Admitting: Family

## 2023-11-09 ENCOUNTER — Ambulatory Visit: Payer: Self-pay | Admitting: Primary Care

## 2023-11-10 MED ORDER — ONDANSETRON 4 MG PO TBDP
4.0000 mg | ORAL_TABLET | Freq: Three times a day (TID) | ORAL | 0 refills | Status: DC | PRN
Start: 2023-11-10 — End: 2023-12-26

## 2023-11-10 NOTE — Addendum Note (Signed)
 Addended by: CORWIN ANTU on: 11/10/2023 11:21 AM   Modules accepted: Orders

## 2023-11-17 ENCOUNTER — Encounter: Payer: Self-pay | Admitting: Family

## 2023-11-25 ENCOUNTER — Telehealth: Payer: Self-pay | Admitting: Family

## 2023-11-25 DIAGNOSIS — K6289 Other specified diseases of anus and rectum: Secondary | ICD-10-CM

## 2023-11-26 NOTE — Progress Notes (Signed)
  Because of your anal pain and unable to take oral antibiotics, I feel your condition warrants further evaluation and I recommend that you be seen in a face-to-face visit.   NOTE: There will be NO CHARGE for this E-Visit   If you are having a true medical emergency, please call 911.     For an urgent face to face visit, [place] has multiple urgent care centers for your convenience.  Click the link below for the full list of locations and hours, walk-in wait times, appointment scheduling options and driving directions:  Urgent Care - [place], [place], [place], [place], [place], [place]  [place]     Your MyChart E-visit questionnaire answers were reviewed by a board certified advanced clinical practitioner to complete your personal care plan based on your specific symptoms.    Thank you for using e-Visits.

## 2023-11-29 ENCOUNTER — Encounter: Payer: Self-pay | Admitting: Family

## 2023-11-29 NOTE — Telephone Encounter (Signed)
 Pt needs to be seen in office.  Had an E visit but couldn't fully evaluate without being seen.

## 2023-11-30 ENCOUNTER — Ambulatory Visit (INDEPENDENT_AMBULATORY_CARE_PROVIDER_SITE_OTHER): Payer: Self-pay | Admitting: Family

## 2023-11-30 ENCOUNTER — Encounter: Payer: Self-pay | Admitting: Family

## 2023-11-30 VITALS — BP 122/88 | HR 63 | Temp 98.6°F | Ht 66.0 in | Wt 138.0 lb

## 2023-11-30 DIAGNOSIS — K5909 Other constipation: Secondary | ICD-10-CM

## 2023-11-30 DIAGNOSIS — K602 Anal fissure, unspecified: Secondary | ICD-10-CM

## 2023-11-30 MED ORDER — NITROGLYCERIN 0.4 % RE OINT: TOPICAL_OINTMENT | RECTAL | 1 refills | Status: DC

## 2023-11-30 MED ORDER — LUBIPROSTONE 24 MCG PO CAPS: 24.0000 ug | ORAL_CAPSULE | Freq: Two times a day (BID) | ORAL | 2 refills | Status: DC

## 2023-11-30 NOTE — Progress Notes (Signed)
 Established Patient Office Visit  Subjective:      CC:  Chief Complaint  Patient presents with   Acute Visit    Possible hemorrhoid?    HPI: Susan Wade is a 43 y.o. female presenting on 11/30/2023 for Acute Visit (Possible hemorrhoid?) .  Discussed the use of AI scribe software for clinical note transcription with the patient, who gave verbal consent to proceed.  History of Present Illness Susan Wade is a 43 year old female who presents with constipation and anal discomfort.  She has a long-standing history of constipation, characterized by infrequent bowel movements, often skipping more than two to three days. Recently, she has been using magnesium citrate as a laxative, which has resulted in bowel movements, though not substantial. She has also used Colace intermittently as a stool softener. No blood or mucus in stool, and bowel movements do not hurt unless she irritates the sore spot. No abdominal pain, though she describes herself as always being gassy.  She describes significant anal discomfort, including a sensation of itching and burning around the anal area, which began around Friday night or Saturday. She initially noticed a sore spot on the right side of her anus, which appeared slightly red and painful, especially when exposed to water pressure or soap. She has been applying hemorrhoid cream, which provided some relief. She also reports a sensation of 'proving out she'll peanut' due to the pain during bowel movements.  She mentions a dull ache in her right lower back and tailbone area, which she describes as an ongoing, non-constant ache that sometimes feels better when she sits on something hard. No fever, chills, or pain radiating down her leg. The ache is not associated with movement and is not hot to the touch.  Her family history includes her father having had colon cancer. She has a GI appointment scheduled for September. She has previously been diagnosed  with C. difficile infection, but she reports no current symptoms related to that. A GI panel conducted in January was negative.         Social history:  Relevant past medical, surgical, family and social history reviewed and updated as indicated. Interim medical history since our last visit reviewed.  Allergies and medications reviewed and updated.  DATA REVIEWED: CHART IN EPIC     ROS: Negative unless specifically indicated above in HPI.    Current Outpatient Medications:    ALPRAZolam  (XANAX ) 0.5 MG tablet, TAKE ONE TABLET BY MOUTH TWICE A DAY AS NEEDED FOR ANXIETY, Disp: 60 tablet, Rfl: 2   butalbital -acetaminophen -caffeine  (FIORICET) 50-325-40 MG tablet, Take 1-2 tablets by mouth every 6 (six) hours as needed for headache., Disp: 20 tablet, Rfl: 0   citalopram  (CELEXA ) 10 MG tablet, Take 1 tablet (10 mg total) by mouth daily., Disp: 30 tablet, Rfl: 3   famotidine  (PEPCID ) 20 MG tablet, Take 1 tablet (20 mg total) by mouth daily. For heartburn, Disp: 30 tablet, Rfl: 0   fluticasone  (FLONASE ) 50 MCG/ACT nasal spray, Place 1 spray into both nostrils 2 (two) times daily., Disp: 16 g, Rfl: 0   lubiprostone  (AMITIZA ) 24 MCG capsule, Take 1 capsule (24 mcg total) by mouth 2 (two) times daily with a meal., Disp: 60 capsule, Rfl: 2   ondansetron  (ZOFRAN -ODT) 4 MG disintegrating tablet, Take 1 tablet (4 mg total) by mouth every 8 (eight) hours as needed for nausea or vomiting., Disp: 20 tablet, Rfl: 0   pantoprazole  (PROTONIX ) 40 MG tablet, Take 1 tablet (40 mg  total) by mouth daily., Disp: 30 tablet, Rfl: 3   pregabalin  (LYRICA ) 75 MG capsule, TAKE ONE CAPSULE BY MOUTH TWICE A DAY, Disp: 60 capsule, Rfl: 2   triamcinolone  cream (KENALOG ) 0.1 %, Apply 1 Application topically 2 (two) times daily., Disp: 30 g, Rfl: 0   hydrOXYzine  (VISTARIL ) 25 MG capsule, Take 1-2 tablets po qhs prn anxiety and or sleep (Patient not taking: Reported on 11/30/2023), Disp: 90 capsule, Rfl: 1   Nitroglycerin   0.4 % OINT, Apply 1 inch (375 mg) ointment intra-anally every 12 hours for anal fissure, Disp: 30 g, Rfl: 1        Objective:        BP 122/88 (BP Location: Left Arm, Patient Position: Sitting, Cuff Size: Normal)   Pulse 63   Temp 98.6 F (37 C) (Temporal)   Ht 5' 6 (1.676 m)   Wt 138 lb (62.6 kg)   LMP 05/30/2019   SpO2 98%   BMI 22.27 kg/m   Physical Exam RECTAL: Small anal fissure on the right side, no external hemorrhoids. No palpable tailbone sacral tenderness, no erythema abscess and or discharge.  Abdomen nontender Wt Readings from Last 3 Encounters:  11/30/23 138 lb (62.6 kg)  11/08/23 139 lb (63 kg)  04/17/23 135 lb (61.2 kg)         Results LABS GI panel: Negative (04/2023)  Assessment & Plan:   Assessment and Plan Assessment & Plan Anal fissure A small anal fissure is present, likely causing significant pain and discomfort, with burning and aching sensations during and after bowel movements. The fissure may cause referred pain to the tailbone area due to internal sphincter spasm. No external hemorrhoids are observed, though internal hemorrhoids could be present. The fissure is likely exacerbated by chronic constipation. - Prescribe nitroglycerin  ointment for application twice daily to the fissure area for 1-1.5 months. - Advise use of Desitin to create a protective barrier, especially after bowel movements. - Educate on the potential for the fissure to cause referred pain to the tailbone area.  Chronic constipation Chronic constipation is a long-standing issue, with infrequent bowel movements leading to significant discomfort and the development of an anal fissure. She is due for a colonoscopy in February to rule out other potential causes such as colon cancer, especially given family history. Amitiza  was discussed as a treatment option, with cost considerations noted. - Prescribe Amitiza  for chronic constipation, to be filled at [facility] if cost is  acceptable. - Advise daily use of stool softeners like Colace to soften stools and prevent painful bowel movements. - Discuss the use of Miralax  regularly for chronic constipation management. - Discuss the potential use of Linzess or Trulance, though cost may be prohibitive.  Recording duration: 27 minutes      Return if symptoms worsen or fail to improve.     Ginger Patrick, MSN, APRN, FNP-C [facility]

## 2023-12-01 ENCOUNTER — Encounter: Payer: Self-pay | Admitting: Family

## 2023-12-07 ENCOUNTER — Encounter: Payer: Self-pay | Admitting: Family

## 2023-12-07 DIAGNOSIS — M533 Sacrococcygeal disorders, not elsewhere classified: Secondary | ICD-10-CM

## 2023-12-11 NOTE — Telephone Encounter (Signed)
 This message as routed to you on Friday at 0729 after the pt's message was received.

## 2023-12-11 NOTE — Telephone Encounter (Signed)
 Please call pt I keep getting her messages too late.  Can you please let her know I am sorry I did not receive her message time to get sent as I was out of the office when the response came through.   How was the weekend? Does she still need pain medication? Fissures can actually take months to heal :(

## 2023-12-13 ENCOUNTER — Ambulatory Visit (INDEPENDENT_AMBULATORY_CARE_PROVIDER_SITE_OTHER)
Admission: RE | Admit: 2023-12-13 | Discharge: 2023-12-13 | Disposition: A | Discharge status: Home / Self Care | Payer: Self-pay | Source: Ambulatory Visit | Attending: Family | Admitting: Family

## 2023-12-13 ENCOUNTER — Ambulatory Visit: Payer: Self-pay | Admitting: Family

## 2023-12-13 DIAGNOSIS — M533 Sacrococcygeal disorders, not elsewhere classified: Secondary | ICD-10-CM

## 2023-12-13 NOTE — Telephone Encounter (Signed)
 Dr. Unk,   This pt is consulting with you in September for some ongoing GI issues. However, I recently evaluated her in the office and she had an anal fissure, it has improved slightly with nitroglycerin  however she is having worsening tail bone pain that causes her to feel rectal pressure, and it is become more constant and severe. There is some improvement slightly when she sits directly on her bottom.  She said her bowel movements have been excruciating. She is having to take laxatives and colace twice a day just to function. I did do an XRAY of the sacrum that was unremarkable so I am worried it might be something intestinal. Is there something I am missing? She does have [facility] colon cancer, Dad age 40 dx, two cousins in their 83's and 69's and PGF with colon cancer.   And is there any way she could be worked in sooner?

## 2023-12-14 NOTE — Telephone Encounter (Signed)
 Can you please call in  0.125% nitroglycerin  with 5% lidocaine  as a compounding medication to [facility] pharmacy, directions are to apply pea size about an inch inside rectum for 3-4 times daily

## 2023-12-14 NOTE — Telephone Encounter (Signed)
 Thank you so much, I appreciate the recommendations.  I am assuming your office will likely reach out to Susan Wade to get in for a colonoscopy?   Thanks again.  Sending in the compound RX now

## 2023-12-18 ENCOUNTER — Other Ambulatory Visit: Payer: Self-pay | Admitting: Family

## 2023-12-18 DIAGNOSIS — F419 Anxiety disorder, unspecified: Secondary | ICD-10-CM

## 2023-12-19 ENCOUNTER — Encounter: Payer: Self-pay | Admitting: Family

## 2023-12-20 NOTE — Telephone Encounter (Signed)
 The pharmacy is going to need specific amounts of nitroglycerin  and lidocaine . I will also need to know the full quantity that's needed.

## 2023-12-20 NOTE — Telephone Encounter (Signed)
 Dr. Unk,   I am so sorry to reach out again however I am having trouble having [facility] fill the lidocaine /nitroglycerin  ointment. They want to know quantify, is it a 50/50 mix? Do you happen to have saved what you write on the sig? Thank you so much in advance.

## 2023-12-20 NOTE — Telephone Encounter (Signed)
 Manuelita I have reached out to Dr. Unk to see if she can help with specifics and I have notified pt.sorry

## 2023-12-20 NOTE — Telephone Encounter (Signed)
 Can you please call in  0.125% nitroglycerin  with 5% lidocaine  as a compounding medication to [facility] pharmacy, directions are to apply pea size about an inch inside rectum for 3-4 times daily

## 2023-12-22 NOTE — Telephone Encounter (Signed)
 Called into pharmacy

## 2023-12-26 ENCOUNTER — Encounter: Payer: Self-pay | Admitting: Gastroenterology

## 2023-12-26 NOTE — Anesthesia Preprocedure Evaluation (Addendum)
 Anesthesia Evaluation  Patient identified by MRN, date of birth, ID band Patient awake    Reviewed: Allergy & Precautions, H&P , NPO status , Patient's Chart, lab work & pertinent test results  Airway Mallampati: II  TM Distance: >3 FB Neck ROM: Full    Dental no notable dental hx. (+) Caps Veneers upper front teeth:   Pulmonary Current Smoker and Patient abstained from smoking.   Pulmonary exam normal breath sounds clear to auscultation       Cardiovascular negative cardio ROS Normal cardiovascular exam Rhythm:Regular Rate:Normal     Neuro/Psych  PSYCHIATRIC DISORDERS Anxiety Depression    negative neurological ROS  negative psych ROS   GI/Hepatic negative GI ROS, Neg liver ROS,GERD  ,,  Endo/Other  negative endocrine ROS    Renal/GU negative Renal ROS  negative genitourinary   Musculoskeletal negative musculoskeletal ROS (+)    Abdominal   Peds negative pediatric ROS (+)  Hematology negative hematology ROS (+)   Anesthesia Other Findings Scoliosis  GERD (gastroesophageal reflux disease) Anxiety  History of Clostridium difficile colitis History of hysterectomy    Reproductive/Obstetrics negative OB ROS                              Anesthesia Physical Anesthesia Plan  ASA: 2  Anesthesia Plan: General   Post-op Pain Management:    Induction: Intravenous  PONV Risk Score and Plan:   Airway Management Planned: Natural Airway and Nasal Cannula  Additional Equipment:   Intra-op Plan:   Post-operative Plan:   Informed Consent: I have reviewed the patients History and Physical, chart, labs and discussed the procedure including the risks, benefits and alternatives for the proposed anesthesia with the patient or authorized representative who has indicated his/her understanding and acceptance.     Dental Advisory Given  Plan Discussed with: Anesthesiologist, CRNA and  Surgeon  Anesthesia Plan Comments: (Patient consented for risks of anesthesia including but not limited to:  - adverse reactions to medications - risk of airway placement if required - damage to eyes, teeth, lips or other oral mucosa - nerve damage due to positioning  - sore throat or hoarseness - Damage to heart, brain, nerves, lungs, other parts of body or loss of life  Patient voiced understanding and assent.)         Anesthesia Quick Evaluation

## 2024-01-01 ENCOUNTER — Ambulatory Visit: Payer: Self-pay | Admitting: Anesthesiology

## 2024-01-01 ENCOUNTER — Other Ambulatory Visit: Payer: Self-pay

## 2024-01-01 ENCOUNTER — Telehealth: Payer: Self-pay | Admitting: Family Medicine

## 2024-01-01 ENCOUNTER — Ambulatory Visit
Admission: RE | Admit: 2024-01-01 | Discharge: 2024-01-01 | Disposition: A | Discharge status: Home / Self Care | Payer: Self-pay | Attending: Gastroenterology | Admitting: Gastroenterology

## 2024-01-01 ENCOUNTER — Encounter: Payer: Self-pay | Admitting: Gastroenterology

## 2024-01-01 ENCOUNTER — Encounter
Admission: RE | Disposition: A | Discharge status: Home / Self Care | Payer: Self-pay | Source: Home / Self Care | Attending: Gastroenterology

## 2024-01-01 DIAGNOSIS — F419 Anxiety disorder, unspecified: Secondary | ICD-10-CM | POA: Insufficient documentation

## 2024-01-01 DIAGNOSIS — K644 Residual hemorrhoidal skin tags: Secondary | ICD-10-CM | POA: Insufficient documentation

## 2024-01-01 DIAGNOSIS — F32A Depression, unspecified: Secondary | ICD-10-CM | POA: Insufficient documentation

## 2024-01-01 DIAGNOSIS — F17210 Nicotine dependence, cigarettes, uncomplicated: Secondary | ICD-10-CM | POA: Insufficient documentation

## 2024-01-01 DIAGNOSIS — Z800 Family history of malignant neoplasm of digestive organs: Secondary | ICD-10-CM | POA: Insufficient documentation

## 2024-01-01 DIAGNOSIS — Z1211 Encounter for screening for malignant neoplasm of colon: Secondary | ICD-10-CM | POA: Insufficient documentation

## 2024-01-01 DIAGNOSIS — K649 Unspecified hemorrhoids: Secondary | ICD-10-CM

## 2024-01-01 HISTORY — PX: COLONOSCOPY: SHX5424

## 2024-01-01 HISTORY — DX: Gastro-esophageal reflux disease without esophagitis: K21.9

## 2024-01-01 HISTORY — DX: Personal history of other infectious and parasitic diseases: Z86.19

## 2024-01-01 HISTORY — DX: Anxiety disorder, unspecified: F41.9

## 2024-01-01 HISTORY — DX: Acquired absence of both cervix and uterus: Z90.710

## 2024-01-01 SURGERY — COLONOSCOPY
Anesthesia: General | Site: Rectum

## 2024-01-01 MED ORDER — LACTATED RINGERS IV SOLN: INTRAVENOUS | Status: DC

## 2024-01-01 MED ORDER — PROPOFOL 10 MG/ML IV BOLUS
INTRAVENOUS | Status: DC | PRN
  Administered 2024-01-01: 30 mg via INTRAVENOUS
  Administered 2024-01-01: 20 mg via INTRAVENOUS
  Administered 2024-01-01: 30 mg via INTRAVENOUS
  Administered 2024-01-01: 40 mg via INTRAVENOUS
  Administered 2024-01-01: 20 mg via INTRAVENOUS
  Administered 2024-01-01: 30 mg via INTRAVENOUS
  Administered 2024-01-01: 100 mg via INTRAVENOUS
  Administered 2024-01-01: 20 mg via INTRAVENOUS
  Administered 2024-01-01: 30 mg via INTRAVENOUS
  Administered 2024-01-01: 20 mg via INTRAVENOUS
  Administered 2024-01-01: 40 mg via INTRAVENOUS

## 2024-01-01 MED ORDER — LIDOCAINE 2% (20 MG/ML) 5 ML SYRINGE
INTRAMUSCULAR | Status: DC | PRN
  Administered 2024-01-01: 550 mg via INTRAVENOUS

## 2024-01-01 MED ORDER — HYDROCORTISONE ACETATE 25 MG RE SUPP: 25.0000 mg | Freq: Two times a day (BID) | RECTAL | 0 refills | Status: DC

## 2024-01-01 MED ORDER — STERILE WATER FOR IRRIGATION IR SOLN
Status: DC | PRN
  Administered 2024-01-01: 1

## 2024-01-01 MED ORDER — PROPOFOL 1000 MG/100ML IV EMUL
INTRAVENOUS | Status: AC
  Filled 2024-01-01: qty 100

## 2024-01-01 MED ORDER — LIDOCAINE HCL (PF) 2 % IJ SOLN
INTRAMUSCULAR | Status: AC
  Filled 2024-01-01: qty 15

## 2024-01-01 MED ORDER — PROPOFOL 10 MG/ML IV BOLUS
INTRAVENOUS | Status: AC
Start: 2024-01-01 — End: 2024-01-01
  Filled 2024-01-01: qty 20

## 2024-01-01 MED ORDER — SODIUM CHLORIDE 0.9 % IV SOLN: INTRAVENOUS | Status: DC

## 2024-01-01 SURGICAL SUPPLY — 4 items
GOWN CVR UNV OPN BCK APRN NK (MISCELLANEOUS) ×2 IMPLANT
KIT PRC NS LF DISP ENDO (KITS) ×1 IMPLANT
MANIFOLD NEPTUNE II (INSTRUMENTS) ×1 IMPLANT
WATER STERILE IRR 250ML POUR (IV SOLUTION) ×1 IMPLANT

## 2024-01-01 NOTE — Transfer of Care (Signed)
 Immediate Anesthesia Transfer of Care Note  Patient: Susan Wade  Procedure(s) Performed: COLONOSCOPY (Rectum)  Patient Location: PACU  Anesthesia Type: General  Level of Consciousness: awake, alert  and patient cooperative  Airway and Oxygen Therapy: Patient Spontanous Breathing and Patient connected to supplemental oxygen  Post-op Assessment: Post-op Vital signs reviewed, Patient's Cardiovascular Status Stable, Respiratory Function Stable, Patent Airway and No signs of Nausea or vomiting  Post-op Vital Signs: Reviewed and stable  Complications: No notable events documented.

## 2024-01-01 NOTE — H&P (Signed)
 Susan JONELLE Brooklyn, MD [facility], [address]IP [address]  [place], [place] [address]  Main: [phone] Fax:  [phone] Pager: [phone]   Primary Care Physician:  Corwin Antu, FNP Primary Gastroenterologist:  Dr. Corinn JONELLE Wade  Pre-Procedure History & Physical: HPI:  Susan Wade is a 43 y.o. female is here for an colonoscopy.   Past Medical History:  Diagnosis Date   Anxiety    GERD (gastroesophageal reflux disease)    History of Clostridium difficile colitis    Scoliosis     Past Surgical History:  Procedure Laterality Date   COLONOSCOPY     CYSTOSCOPY N/A 05/30/2019   Procedure: CYSTOSCOPY;  Surgeon: Lake Read, MD;  Location: [facility] ORS;  Service: Gynecology;  Laterality: N/A;   TOTAL LAPAROSCOPIC HYSTERECTOMY WITH SALPINGECTOMY N/A 05/30/2019   Procedure: TOTAL LAPAROSCOPIC HYSTERECTOMY WITH BILATERAL SALPINGECTOMY;  Surgeon: Lake Read, MD;  Location: [facility] ORS;  Service: Gynecology;  Laterality: N/A;   TUBAL LIGATION  2011   UPPER GI ENDOSCOPY      Prior to Admission medications   Medication Sig Start Date End Date Taking? Authorizing Provider  ALPRAZolam  (XANAX ) 0.5 MG tablet TAKE ONE TABLET BY MOUTH TWICE A DAY AS NEEDED FOR ANXIETY 12/20/23   Corwin Antu, FNP  butalbital -acetaminophen -caffeine  (FIORICET) 50-325-40 MG tablet Take 1-2 tablets by mouth every 6 (six) hours as needed for headache. 04/17/23 04/16/24  Arlander Charleston, MD  fluticasone  (FLONASE ) 50 MCG/ACT nasal spray Place 1 spray into both nostrils 2 (two) times daily. 11/08/23   Gretta Comer POUR, NP  pregabalin  (LYRICA ) 75 MG capsule TAKE ONE CAPSULE BY MOUTH TWICE A DAY 09/21/23   Corwin Antu, FNP    Allergies as of 12/15/2023 - Review Complete 12/12/2023  Allergen Reaction Noted   Amitriptyline   03/21/2023   Buspirone  Hives 09/02/2022    Family History  Problem Relation Age of Onset   Hypertension Mother    Hyperlipidemia Mother    Breast  cancer Mother 73   Colon cancer Father 24   Rectal cancer Father    Healthy Sister    Colon polyps Sister 46   Breast cancer Maternal Grandmother 24   Hyperlipidemia Maternal Grandfather    Hypertension Maternal Grandfather    Diabetes Paternal Grandmother    Heart disease Paternal Grandmother    Hyperlipidemia Paternal Grandmother    Hypertension Paternal Grandmother    Colon cancer Paternal Grandfather    Colon cancer Cousin 35   Colon cancer Cousin 70   Breast cancer Other 33    Social History   Socioeconomic History   Marital status: Married    Spouse name: Penne   Number of children: 2   Years of education: high school   Highest education level: Not on file  Occupational History   Occupation: receptionist  Tobacco Use   Smoking status: Every Day    Current packs/day: 0.50    Average packs/day: 0.5 packs/day for 17.0 years (8.5 ttl pk-yrs)    Types: Cigarettes   Smokeless tobacco: Never  Vaping Use   Vaping status: Never Used  Substance and Sexual Activity   Alcohol use: Yes    Comment: glass of wine a few nights socially   Drug use: Never   Sexual activity: Yes    Partners: Male    Birth control/protection: Surgical  Other Topics Concern   Not on file  Social History Narrative   03/21/19   From: the area   Living: with husband and children  Work: Scientist, physiological      Family: [place] and Museum/gallery exhibitions officer (2011 and 2006)      Enjoys: hiking, go to [employer] (family in [place])      Exercise: going to the gym - walking the treadmill, weight lifting   Diet: fruits, veggies, fish/chicken, some red meats      Safety   Seat belts: Yes    Guns: No   Safe in relationships: Yes    Social Drivers of Corporate investment banker Strain: Low Risk  (03/21/2019)   Overall Financial Resource Strain (CARDIA)    Difficulty of Paying Living Expenses: Not hard at all  Food Insecurity: Not on file  Transportation Needs: Not on file  Physical Activity: Not on file  Stress:  Not on file  Social Connections: Not on file  Intimate Partner Violence: Not on file    Review of Systems: See HPI, otherwise negative ROS  Physical Exam: BP 115/73   Pulse 67   Temp 98.5 F (36.9 C) (Temporal)   Resp 12   Ht 5' 6 (1.676 m)   Wt 60.8 kg   LMP 05/30/2019   SpO2 100%   BMI 21.63 kg/m  General:   Alert,  pleasant and cooperative in NAD Head:  Normocephalic and atraumatic. Neck:  Supple; no masses or thyromegaly. Lungs:  Clear throughout to auscultation.    Heart:  Regular rate and rhythm. Abdomen:  Soft, nontender and nondistended. Normal bowel sounds, without guarding, and without rebound.   Neurologic:  Alert and  oriented x4;  grossly normal neurologically.  Impression/Plan: Susan Wade is here for an colonoscopy to be performed for [facility] colon cancer, Dad age 17 dx, two cousins in their 46's and 60's   Risks, benefits, limitations, and alternatives regarding  colonoscopy have been reviewed with the patient.  Questions have been answered.  All parties agreeable.   Susan Brooklyn, MD  01/01/2024, 7:41 AM

## 2024-01-01 NOTE — Anesthesia Postprocedure Evaluation (Signed)
 Anesthesia Post Note  Patient: Susan Wade  Procedure(s) Performed: COLONOSCOPY (Rectum)  Patient location during evaluation: PACU Anesthesia Type: General Level of consciousness: awake and alert Pain management: pain level controlled Vital Signs Assessment: post-procedure vital signs reviewed and stable Respiratory status: spontaneous breathing, nonlabored ventilation, respiratory function stable and patient connected to nasal cannula oxygen Cardiovascular status: blood pressure returned to baseline and stable Postop Assessment: no apparent nausea or vomiting Anesthetic complications: no   No notable events documented.   Last Vitals:  Vitals:   01/01/24 0904 01/01/24 0917  BP: 102/64 127/74  Pulse: 65 65  Resp: 14   Temp: (!) 36.3 C (!) 36.3 C  SpO2: 99% 99%    Last Pain:  Vitals:   01/01/24 0917  TempSrc:   PainSc: 0-No pain                 Donny JAYSON Mu

## 2024-01-01 NOTE — Op Note (Signed)
 [facility] Patient Name: Susan Wade Procedure Date: 01/01/2024 8:29 AM MRN: [id number] Account #: [id number] Date of Birth: Jun 09, 1980 Admit Type: Outpatient Age: 43 Room: [facility] OR ROOM 01 Gender: Female Note Status: Finalized Instrument Name: Arvis [id number] Procedure:             Colonoscopy Indications:           Screening for colon cancer: Family history of                         colorectal cancer in multiple 2nd degree relatives,                         Screening in patient at increased risk: Family history                         of 1st-degree relative with colorectal cancer before                         age 74 years, This is the patient's first colonoscopy Providers:             Corinn Jess Brooklyn MD, MD Referring MD:          Ginger Patrick (Referring MD) Medicines:             General Anesthesia Complications:         No immediate complications. Estimated blood loss: None. Procedure:             Pre-Anesthesia Assessment:                        - Prior to the procedure, a History and Physical was                         performed, and patient medications and allergies were                         reviewed. The patient is competent. The risks and                         benefits of the procedure and the sedation options and                         risks were discussed with the patient. All questions                         were answered and informed consent was obtained.                         Patient identification and proposed procedure were                         verified by the physician, the nurse, the                         anesthesiologist, the anesthetist and the technician                         in the pre-procedure area in the procedure room in the  endoscopy suite. Mental Status Examination: alert and                         oriented. Airway Examination: normal oropharyngeal                          airway and neck mobility. Respiratory Examination:                         clear to auscultation. CV Examination: normal.                         Prophylactic Antibiotics: The patient does not require                         prophylactic antibiotics. Prior Anticoagulants: The                         patient has taken no anticoagulant or antiplatelet                         agents. ASA Grade Assessment: II - A patient with mild                         systemic disease. After reviewing the risks and                         benefits, the patient was deemed in satisfactory                         condition to undergo the procedure. The anesthesia                         plan was to use general anesthesia. Immediately prior                         to administration of medications, the patient was                         re-assessed for adequacy to receive sedatives. The                         heart rate, respiratory rate, oxygen saturations,                         blood pressure, adequacy of pulmonary ventilation, and                         response to care were monitored throughout the                         procedure. The physical status of the patient was                         re-assessed after the procedure.                        After obtaining informed consent, the colonoscope was  passed under direct vision. Throughout the procedure,                         the patient's blood pressure, pulse, and oxygen                         saturations were monitored continuously. The                         Colonoscope was introduced through the anus and                         advanced to the the cecum, identified by appendiceal                         orifice and ileocecal valve. The colonoscopy was                         performed with moderate difficulty due to a tortuous                         colon. Successful completion of the procedure was                          aided by applying abdominal pressure. The patient                         tolerated the procedure well. The quality of the bowel                         preparation was good. The ileocecal valve, appendiceal                         orifice, and rectum were photographed. Findings:      The perianal and digital rectal examinations were normal. Pertinent       negatives include normal sphincter tone and no palpable rectal lesions.      Non-bleeding external hemorrhoids were found during retroflexion. The       hemorrhoids were medium-sized.      The exam was otherwise without abnormality. Impression:            - Non-bleeding external hemorrhoids.                        - The examination was otherwise normal.                        - No specimens collected. Recommendation:        - Discharge patient to home (with escort).                        - Resume previous diet today.                        - Continue present medications.                        - Repeat colonoscopy in 5 years for screening purposes.                        -  Return to my office as previously scheduled. Procedure Code(s):     --- Professional ---                        H9894, Colorectal cancer screening; colonoscopy on                         individual at high risk Diagnosis Code(s):     --- Professional ---                        K64.4, Residual hemorrhoidal skin tags                        Z80.0, Family history of malignant neoplasm of                         digestive organs CPT copyright 2022 American Medical Association. All rights reserved. The codes documented in this report are preliminary and upon coder review may  be revised to meet current compliance requirements. Dr. Corinn Brooklyn Corinn Jess Brooklyn MD, MD 01/01/2024 9:02:25 AM This report has been signed electronically. Number of Addenda: 0 Note Initiated On: 01/01/2024 8:29 AM Scope Withdrawal Time: 0 hours 8 minutes 37 seconds  Total Procedure  Duration: 0 hours 21 minutes 58 seconds  Estimated Blood Loss:  Estimated blood loss: none.      [facility]

## 2024-01-01 NOTE — Progress Notes (Signed)

## 2024-01-04 ENCOUNTER — Encounter: Payer: Self-pay | Admitting: Family

## 2024-01-04 DIAGNOSIS — G8918 Other acute postprocedural pain: Secondary | ICD-10-CM

## 2024-01-05 DIAGNOSIS — G8918 Other acute postprocedural pain: Secondary | ICD-10-CM | POA: Insufficient documentation

## 2024-01-05 MED ORDER — HYDROCODONE-ACETAMINOPHEN 5-325 MG PO TABS: 1.0000 | ORAL_TABLET | Freq: Four times a day (QID) | ORAL | 0 refills | Status: DC | PRN

## 2024-01-10 ENCOUNTER — Other Ambulatory Visit: Payer: Self-pay | Admitting: Family

## 2024-01-10 DIAGNOSIS — G8918 Other acute postprocedural pain: Secondary | ICD-10-CM

## 2024-01-10 DIAGNOSIS — N3010 Interstitial cystitis (chronic) without hematuria: Secondary | ICD-10-CM

## 2024-01-23 MED ORDER — HYDROCODONE-ACETAMINOPHEN 5-325 MG PO TABS: 1.0000 | ORAL_TABLET | Freq: Four times a day (QID) | ORAL | 0 refills | Status: AC | PRN

## 2024-01-23 NOTE — Addendum Note (Signed)
 Addended by: CORWIN ANTU on: 01/23/2024 02:50 PM   Modules accepted: Orders

## 2024-01-24 ENCOUNTER — Telehealth: Payer: Self-pay | Admitting: Family

## 2024-01-24 ENCOUNTER — Encounter: Payer: Self-pay | Admitting: Family

## 2024-01-24 ENCOUNTER — Ambulatory Visit (INDEPENDENT_AMBULATORY_CARE_PROVIDER_SITE_OTHER): Payer: Self-pay | Admitting: Family

## 2024-01-24 VITALS — BP 122/88 | HR 80 | Temp 98.4°F | Ht 66.0 in | Wt 134.0 lb

## 2024-01-24 DIAGNOSIS — B9689 Other specified bacterial agents as the cause of diseases classified elsewhere: Secondary | ICD-10-CM

## 2024-01-24 DIAGNOSIS — N760 Acute vaginitis: Secondary | ICD-10-CM

## 2024-01-24 MED ORDER — METRONIDAZOLE 500 MG PO TABS: 500.0000 mg | ORAL_TABLET | Freq: Two times a day (BID) | ORAL | 0 refills | Status: AC

## 2024-01-24 NOTE — Telephone Encounter (Signed)
 Pt here with ongoing concerns with symptoms.  She is still having pressure in her rectal area/tailbone and still with feeling of needing to push. She is on day three of suppositories.   She also feels the pressure in her vaginal canal.  She had a really poor recovery from her banding and doesn't feel she can proceed with the other two sessions.   My question for you, do you feel this rectal tailbone pressure is soley from these hemorrhoids? I would think yes, and reassuring with unremarkable colonoscopy but this would mean she needs to have the banding for complete resolution/relief? Would her symptoms likely improve with this procedure, are there other options?  Pelvic exam in office does not suggest a vaginal or rectal prolapse.   Thanks for your time.   Regards,   Ginger Patrick FNP-C

## 2024-01-24 NOTE — Progress Notes (Signed)
 Established Patient Office Visit  Subjective:      CC:  Chief Complaint  Patient presents with   Medical Management of Chronic Issues    HPI: Susan Wade is a 43 y.o. female presenting on 01/24/2024 for Medical Management of Chronic Issues .  Discussed the use of AI scribe software for clinical note transcription with the patient, who gave verbal consent to proceed.   History of Present Illness Susan Wade is a 43 year old female with hemorrhoids who presents with ongoing rectal pressure and pain.  She experiences significant discomfort and pressure in the rectal area due to hemorrhoids, with the sensation sometimes radiating to the vaginal area. She underwent hemorrhoid banding, requiring three sessions, but was unable to complete the second session due to severe discomfort, leaving her bedridden for two days. Prior to the banding, hemorrhoids would protrude during bowel movements and retract afterward.  She describes a sensation of needing to push during bowel movements and sometimes feels as though the bowel movement is incomplete. She experiences pressure on her tailbone, necessitating the use of a cushion at work. The pressure sometimes builds up, especially after using a suppository at night. She has been using suppositories for three days as part of her current treatment regimen.  She mentions a past episode of a purplish-blue, skin-colored protrusion in the rectal area, which bled upon pressure, suspected to be a thrombosed hemorrhoid, but it has since resolved. She reports occasional vaginal pressure, particularly during bowel movements or when exerting pressure, and sometimes experiences a sensation akin to a 'paper cut' in the vaginal area.  She has a history of interstitial cystitis, which has been stable, though she occasionally experiences urinary leakage, particularly when coughing. Sexual intercourse can sometimes cause discomfort, and she has noticed increased  vaginal discharge, described as milky.  She experiences chronic back pain and has a history of pelvic floor issues. She has not been on any prior treatments for hemorrhoids other than the banding and a cream for a fissure, which has since resolved. No burning during urination or significant urinary leakage.         Social history:  Relevant past medical, surgical, family and social history reviewed and updated as indicated. Interim medical history since our last visit reviewed.  Allergies and medications reviewed and updated.  DATA REVIEWED: CHART IN EPIC     ROS: Negative unless specifically indicated above in HPI.    Current Outpatient Medications:    hydrocortisone  2.5 % cream, Apply 1 Application topically 2 (two) times daily., Disp: , Rfl:    linaclotide (LINZESS) 145 MCG CAPS capsule, Take 145 mcg by mouth daily before breakfast., Disp: , Rfl:    metroNIDAZOLE  (FLAGYL ) 500 MG tablet, Take 1 tablet (500 mg total) by mouth 2 (two) times daily for 7 days., Disp: 14 tablet, Rfl: 0   ALPRAZolam  (XANAX ) 0.5 MG tablet, TAKE ONE TABLET BY MOUTH TWICE A DAY AS NEEDED FOR ANXIETY, Disp: 60 tablet, Rfl: 2   butalbital -acetaminophen -caffeine  (FIORICET) 50-325-40 MG tablet, Take 1-2 tablets by mouth every 6 (six) hours as needed for headache., Disp: 20 tablet, Rfl: 0   fluticasone  (FLONASE ) 50 MCG/ACT nasal spray, Place 1 spray into both nostrils 2 (two) times daily., Disp: 16 g, Rfl: 0   hydrocortisone  (ANUSOL -HC) 25 MG suppository, Place 1 suppository (25 mg total) rectally 2 (two) times daily., Disp: 12 suppository, Rfl: 0   pregabalin  (LYRICA ) 75 MG capsule, TAKE ONE CAPSULE BY MOUTH TWICE A DAY, Disp:  60 capsule, Rfl: 2        Objective:        BP 122/88 (BP Location: Right Arm, Patient Position: Sitting, Cuff Size: Normal)   Pulse 80   Temp 98.4 F (36.9 C) (Temporal)   Ht 5' 6 (1.676 m)   Wt 134 lb (60.8 kg)   LMP 05/30/2019   SpO2 99%   BMI 21.63 kg/m    Physical Exam GENITOURINARY: Vagina slightly tender on manipulation. Vaginal discharge present. No vaginal prolapse. RECTAL: Internal hemorrhoid present. No external hemorrhoids. No rectal prolapse.  Wt Readings from Last 3 Encounters:  01/24/24 134 lb (60.8 kg)  01/01/24 134 lb (60.8 kg)  11/30/23 138 lb (62.6 kg)    Physical Exam Vitals reviewed.  Constitutional:      General: She is not in acute distress.    Appearance: Normal appearance. She is normal weight. She is not ill-appearing, toxic-appearing or diaphoretic.  HENT:     Head: Normocephalic.  Cardiovascular:     Rate and Rhythm: Normal rate.  Pulmonary:     Effort: Pulmonary effort is normal.  Genitourinary:    General: Normal vulva.     Pubic Area: No rash.      Labia:        Right: No rash or tenderness.        Left: No rash or tenderness.      Urethra: No prolapse.     Cervix: Discharge (white thin milky discharge) present.     Uterus: No uterine prolapse.      Rectum: External hemorrhoid present. No mass or tenderness.  Musculoskeletal:        General: Normal range of motion.  Neurological:     General: No focal deficit present.     Mental Status: She is alert and oriented to person, place, and time. Mental status is at baseline.  Psychiatric:        Mood and Affect: Mood normal.        Behavior: Behavior normal.        Thought Content: Thought content normal.        Judgment: Judgment normal.          Results Colonoscopy: Normal  Pelvic Exam (01/24/2024): No vaginal or rectal prolapse, internal hemorrhoid present, no vaginal irritation or discharge, no tenderness, no odor.  Assessment & Plan:   Assessment and Plan Assessment & Plan Internal hemorrhoids with chronic constipation Ongoing pressure and discomfort in the rectal area, particularly around the tailbone, with a sensation of needing to push. Previous hemorrhoid banding procedure was traumatic and resulted in significant pain.  Examination revealed an internal hemorrhoid without thrombosis. No evidence of rectal or vaginal prolapse. Symptoms may be exacerbated by chronic constipation. Pain with intercourse and bowel movements noted, possibly related to hemorrhoidal pressure. She is experiencing significant discomfort and has been using a cushion at work to alleviate tailbone pressure. She is on day three of a fourteen-day suppository treatment. - Continue current suppository treatment for hemorrhoids. - Avoid straining during bowel movements. - Consider pelvic floor therapy for pain with intercourse and bowel movement difficulties. - Use stool softeners and Miralax  as needed to prevent constipation. - Discuss alternative treatment options for hemorrhoids with the specialist. - Consider repeating hemorrhoid banding if symptoms persist.  Vaginal discharge, possible bacterial vaginosis Presence of milky discharge, which may indicate bacterial vaginosis. No significant vaginal irritation, itching, or odor noted. Symptoms include pain with intercourse, which may be related to the discharge. -  Prescribe Flagyl  for suspected bacterial vaginosis. - Advise against alcohol consumption while taking Flagyl .       Return if symptoms worsen or fail to improve.     Ginger Patrick, MSN, APRN, FNP-C [facility]

## 2024-01-24 NOTE — Telephone Encounter (Signed)
 Thank you :)

## 2024-01-28 ENCOUNTER — Encounter: Payer: Self-pay | Admitting: Family

## 2024-01-28 DIAGNOSIS — K909 Intestinal malabsorption, unspecified: Secondary | ICD-10-CM

## 2024-01-30 ENCOUNTER — Encounter: Payer: Self-pay | Admitting: Gastroenterology

## 2024-01-30 ENCOUNTER — Telehealth: Payer: Self-pay | Admitting: Gastroenterology

## 2024-01-30 NOTE — Telephone Encounter (Signed)
 OK for APP clinic or mine, whichever is earlier [employer]

## 2024-01-30 NOTE — Telephone Encounter (Signed)
 Goodmorning Dr.Gupta,  DOD AM 01/30/24  Patients husband calling to schedule consultation for wife due to her experiencing abdominal pain, constipation and change in her stool. Patient was last seen at [facility] 12/2023 but is unhappy with care. Patients husband states wife feels uncomfortable at [facility] due to not getting any answers for how she is feeling. Patient does not have a specific provider they would like to be scheduled with. Records found in epic.  Please advise for future scheduling Thank you

## 2024-01-31 ENCOUNTER — Other Ambulatory Visit: Payer: Self-pay

## 2024-01-31 ENCOUNTER — Encounter: Payer: Self-pay | Admitting: Physician Assistant

## 2024-01-31 DIAGNOSIS — K909 Intestinal malabsorption, unspecified: Secondary | ICD-10-CM

## 2024-02-01 LAB — CELIAC DISEASE PANEL
(tTG) Ab, IgA: <1 U/mL
(tTG) Ab, IgG: <1 U/mL
Gliadin IgA: <1 U/mL
Gliadin IgG: <1 U/mL
Immunoglobulin A: 144 mg/dL (ref 47–310)

## 2024-02-02 ENCOUNTER — Ambulatory Visit: Payer: Self-pay | Admitting: Family

## 2024-02-18 ENCOUNTER — Encounter: Payer: Self-pay | Admitting: Family

## 2024-02-18 DIAGNOSIS — K5900 Constipation, unspecified: Secondary | ICD-10-CM

## 2024-02-18 DIAGNOSIS — K648 Other hemorrhoids: Secondary | ICD-10-CM

## 2024-02-18 DIAGNOSIS — G8918 Other acute postprocedural pain: Secondary | ICD-10-CM

## 2024-02-19 MED ORDER — HYDROCORTISONE ACETATE 25 MG RE SUPP: 25.0000 mg | Freq: Two times a day (BID) | RECTAL | 0 refills | Status: AC

## 2024-02-23 MED ORDER — SENNA-DOCUSATE SODIUM 8.6-50 MG PO TABS: ORAL_TABLET | ORAL | 0 refills | Status: AC

## 2024-02-23 MED ORDER — HYDROCODONE-ACETAMINOPHEN 5-325 MG PO TABS
1.0000 | ORAL_TABLET | Freq: Four times a day (QID) | ORAL | 0 refills | Status: AC | PRN
Start: 2024-02-23 — End: 2024-02-28

## 2024-02-23 NOTE — Addendum Note (Signed)
 Addended by: CORWIN ANTU on: 02/23/2024 11:13 AM   Modules accepted: Orders

## 2024-03-03 ENCOUNTER — Telehealth: Payer: Self-pay | Admitting: Family

## 2024-03-03 DIAGNOSIS — H109 Unspecified conjunctivitis: Secondary | ICD-10-CM

## 2024-03-03 MED ORDER — POLYMYXIN B-TRIMETHOPRIM 10000-0.1 UNIT/ML-% OP SOLN: 1.0000 [drp] | Freq: Four times a day (QID) | OPHTHALMIC | 0 refills | Status: AC

## 2024-03-03 NOTE — Progress Notes (Signed)
 We are sorry that you are not feeling well.  Here is how we plan to help!  Based on what you have shared with me it looks like you have conjunctivitis.  Conjunctivitis is a common inflammatory or infectious condition of the eye that is often referred to as pink eye.  In most cases it is contagious (viral or bacterial). However, not all conjunctivitis requires antibiotics (ex. Allergic).  We have made appropriate suggestions for you based upon your presentation.  I have prescribed Polytrim Ophthalmic drops 1-2 drops 4 times a day times 5 days  Pink eye can be highly contagious.  It is typically spread through direct contact with secretions, or contaminated objects or surfaces that one may have touched.  Strict handwashing is suggested with soap and water is urged.  If not available, use alcohol based had sanitizer.  Avoid unnecessary touching of the eye.  If you wear contact lenses, you will need to refrain from wearing them until you see no white discharge from the eye for at least 24 hours after being on medication.  You should see symptom improvement in 1-2 days after starting the medication regimen.  Call us  if symptoms are not improved in 1-2 days.  Home Care: Wash your hands often! Do not wear your contacts until you complete your treatment plan. Avoid sharing towels, bed linen, personal items with a person who has pink eye. See attention for anyone in your home with similar symptoms.  Get Help Right Away If: Your symptoms do not improve. You develop blurred or loss of vision. Your symptoms worsen (increased discharge, pain or redness)  Your e-visit answers were reviewed by a board certified advanced clinical practitioner to complete your personal care plan.  Depending on the condition, your plan could have included both over the counter or prescription medications.  If there is a problem please reply  once you have received a response from your provider.  Your safety is important to  us .  If you have drug allergies check your prescription carefully.    You can use MyChart to ask questions about today's visit, request a non-urgent call back, or ask for a work or school excuse for 24 hours related to this e-Visit. If it has been greater than 24 hours you will need to follow up with your provider, or enter a new e-Visit to address those concerns.   You will get an e-mail in the next two days asking about your experience.  I hope that your e-visit has been valuable and will speed your recovery. Thank you for using e-visits.  I have spent 5 minutes in review of e-visit questionnaire, review and updating patient chart, medical decision making and response to patient.   Bari Learn, FNP

## 2024-03-06 ENCOUNTER — Telehealth: Payer: Self-pay | Admitting: Physician Assistant

## 2024-03-06 DIAGNOSIS — K649 Unspecified hemorrhoids: Secondary | ICD-10-CM

## 2024-03-06 NOTE — Progress Notes (Signed)
  Because you are having worsening symptoms, I feel your condition warrants further evaluation and I recommend that you be seen in a face-to-face visit.   NOTE: There will be NO CHARGE for this E-Visit   If you are having a true medical emergency, please call 911.     For an urgent face to face visit, [place] has multiple urgent care centers for your convenience.  Click the link below for the full list of locations and hours, walk-in wait times, appointment scheduling options and driving directions:  Urgent Care - [place], [place], [place], [place], [place], [place]       Your MyChart E-visit questionnaire answers were reviewed by a board certified advanced clinical practitioner to complete your personal care plan based on your specific symptoms.    Thank you for using e-Visits.

## 2024-03-17 ENCOUNTER — Other Ambulatory Visit: Payer: Self-pay | Admitting: Family

## 2024-03-17 DIAGNOSIS — F419 Anxiety disorder, unspecified: Secondary | ICD-10-CM

## 2024-03-20 ENCOUNTER — Ambulatory Visit: Payer: Self-pay | Admitting: Physician Assistant

## 2024-03-20 ENCOUNTER — Encounter: Payer: Self-pay | Admitting: Physician Assistant

## 2024-03-20 VITALS — BP 126/80 | HR 75 | Ht 66.0 in | Wt 133.0 lb

## 2024-03-20 DIAGNOSIS — N3010 Interstitial cystitis (chronic) without hematuria: Secondary | ICD-10-CM

## 2024-03-20 DIAGNOSIS — K5909 Other constipation: Secondary | ICD-10-CM

## 2024-03-20 DIAGNOSIS — Z800 Family history of malignant neoplasm of digestive organs: Secondary | ICD-10-CM

## 2024-03-20 DIAGNOSIS — K219 Gastro-esophageal reflux disease without esophagitis: Secondary | ICD-10-CM

## 2024-03-20 DIAGNOSIS — K641 Second degree hemorrhoids: Secondary | ICD-10-CM

## 2024-03-20 DIAGNOSIS — K602 Anal fissure, unspecified: Secondary | ICD-10-CM

## 2024-03-20 MED ORDER — NIFEDIPINE 0.3 % OINTMENT: 1.0000 | TOPICAL_OINTMENT | Freq: Three times a day (TID) | CUTANEOUS | 3 refills | Status: AC

## 2024-03-20 MED ORDER — METRONIDAZOLE 500 MG PO TABS: 500.0000 mg | ORAL_TABLET | Freq: Three times a day (TID) | ORAL | 0 refills | Status: AC

## 2024-03-20 MED ORDER — NIFEDIPINE 0.3 % OINTMENT: 1.0000 | TOPICAL_OINTMENT | Freq: Three times a day (TID) | CUTANEOUS | 3 refills | Status: DC | PRN

## 2024-03-20 NOTE — Addendum Note (Signed)
 Addended by: CRAIG PALMA on: 03/20/2024 03:52 PM   Modules accepted: Level of Service

## 2024-03-20 NOTE — Progress Notes (Signed)
 03/20/2024 Susan Wade [id number] [birth year]  Referring provider: Corwin Antu, FNP Primary GI doctor: Dr. Suzann  ASSESSMENT AND PLAN:  Rectal pressure Intermittent, can last for several days, can improve with BM Can start as soon as she standing in the morning Recent fissure treated in July secondary constipation, still with fissure on anoscopy 04/29/2023 CT AB W hepatic cyst, mild fatty infiltration of the liver 11/2023 sacrum coccyx Xray unremarkable 12/2023 normal colonoscopy, no IBD Anorectal pain may be caused by hemorrhoids, anorectal abscesses, anal fissures, rectocele -Will treat nifedamine, did not do as well with NTG - bowel purge with miralax /fiber, then trulance in Jan -possible component of pelvis floor dysfunction with history and symptoms. Refer to pelvic floor PT in JAN -Consider anal manometry.  - consider SIBO testing/treatment  Perianal abscess Indurated ulcer with white cap on posterior right buttock. Possible abscess or ulcer, no induration into her rectum, possibly from waxing/hair follicle - Prescribed metronidazole , declines doxy or ABX due to history of Cdiff, consider CT or MRI in Jan with insurance  Hemorrhoids, grade 2-3 No history of rectal bleeding History of banding x 1, had significant pain afterwards Right posterior hemorrhoid on exam, not signficant Treat fissure  Constipation, history of Cdiff 2024 Amitiza  no help, linzess 145 mg slightly but off due to insurance Colace daily, BM every 1-3 days, can have incomplete Bm's History of 2 kids Hard stool on exam, negative FOBT Likely from pelvic floor - Increase fiber/ water  intake, decrease caffeine , increase activity level. - bowel purge then miralax delvin, consider trulance with insurance -possible component of pelvis floor dysfunction with history and symptoms. Refer to pelvic floor PT in [place] -Consider anal manometry.  - consider SIBO testing/treatment  History of intertisial  cystitis On lyrica  Suggest pelivic floor PT, will refer in Jan  GERD 05/16/2019 gastritis No significant symptoms  Personal history of colon cancer in first-degree and second-degree relatives 12/2023 colonoscopy internal hemorrhoids otherwise unremarkable Likely need 5 year recall  Patient Care Team: Corwin Antu, FNP as PCP - General (Family Medicine)  HISTORY OF PRESENT ILLNESS: 43 y.o. female with a past medical history listed below presents for evaluation of rectal pressure.   Previously seen by Dr. Unk at [facility] 01/04/2024 for chronic constipation and grade 2 hemorrhoids.  Discussed the use of AI scribe software for clinical note transcription with the patient, who gave verbal consent to proceed.  History of Present Illness   Susan Wade is a 43 year old female with internal hemorrhoids who presents with rectal pressure and constipation.  She has been experiencing significant rectal pressure and a constant urge to have a bowel movement since August 2025. The sensation is described as 'terrible, terrible pain' and pressure in the rectal area, with variability in intensity. The pressure is sometimes relieved by a bowel movement, but not consistently. She also describes a sensation akin to 'a heartbeat in your rectum.'  She has a history of internal hemorrhoids, identified as grade two during a colonoscopy in September 2025, which was otherwise normal. She underwent two rounds of Amosol treatment for hemorrhoids and had a banding procedure performed, which resulted in significant pain and pressure post-procedure. The banding was done four days after her colonoscopy, and she experienced intense pressure and sharp pain for several days following the procedure.  She has a history of constipation, severe enough to require manual disimpaction in July 2025, leading to an anal fissure. She was treated with nitroglycerin  cream for eight weeks. She  uses Colace daily to manage her  bowel movements, which occur irregularly, ranging from daily to every five days or longer. She was previously on Linzess, which was effective but is currently unaffordable due to insurance changes. Bowel movements sometimes feel incomplete, possibly due to the persistent pressure.  She has a history of C. difficile infection in 2024 and experiences nausea if she misses doses of Lyrica , which she takes at 75 mg twice daily. No significant heartburn, nausea, or vomiting unrelated to Lyrica  use. Occasional urinary incontinence with coughing or sneezing and lower back pain attributed to scoliosis.  She notes a red area inside the rectum, which is tender but not painful during bowel movements. A CT scan in January 2025 was performed for nausea, vomiting, and abdominal pain, and she also had an x-ray of the sacrum and coccyx. No external hemorrhoids, but a tender area sometimes appears bluish or purplish.        She  reports that she has been smoking cigarettes. She has a 8.5 pack-year smoking history. She has never used smokeless tobacco. She reports current alcohol use. She reports that she does not use drugs.  RELEVANT GI HISTORY, IMAGING AND LABS: Results   RADIOLOGY Sacrum and coccyx X-ray: Normal CT: Normal (04/2023)  DIAGNOSTIC Colonoscopy: Normal except for internal hemorrhoids (12/2023)  PROCEDURE Anoscopy: Posterior right buttock with slightly indurated ulcer with white cap. No extension into rectum. Right posterior hemorrhoid not significantly enlarged. Right posterior fissure present. Negative for blood.      CBC    Component Value Date/Time   WBC 9.8 11/08/2023 1210   RBC 5.08 11/08/2023 1210   HGB 14.6 11/08/2023 1210   HGB 13.4 04/25/2019 1441   HCT 44.4 11/08/2023 1210   HCT 41.0 04/25/2019 1441   PLT 192.0 11/08/2023 1210   PLT 246 04/25/2019 1441   MCV 87.3 11/08/2023 1210   MCV 89 04/25/2019 1441   MCH 29.7 04/29/2023 1203   MCHC 32.9 11/08/2023 1210   RDW 13.7  11/08/2023 1210   RDW 12.9 04/25/2019 1441   LYMPHSABS 1.9 11/08/2023 1210   MONOABS 0.5 11/08/2023 1210   EOSABS 0.1 11/08/2023 1210   BASOSABS 0.1 11/08/2023 1210   Recent Labs    04/03/23 0825 04/17/23 0920 04/29/23 1203 11/08/23 1210  HGB 14.8 14.8 14.5 14.6    CMP     Component Value Date/Time   NA 139 11/08/2023 1210   K 3.9 11/08/2023 1210   CL 104 11/08/2023 1210   CO2 28 11/08/2023 1210   GLUCOSE 83 11/08/2023 1210   BUN 8 11/08/2023 1210   CREATININE 0.67 11/08/2023 1210   CREATININE 0.74 03/15/2023 0919   CALCIUM 9.5 11/08/2023 1210   PROT 7.5 11/08/2023 1210   ALBUMIN 4.8 11/08/2023 1210   AST 12 11/08/2023 1210   ALT 9 11/08/2023 1210   ALKPHOS 50 11/08/2023 1210   BILITOT 0.7 11/08/2023 1210   GFRNONAA >60 04/29/2023 1203   GFRAA >60 06/02/2019 1927      Latest Ref Rng & Units 11/08/2023   12:10 PM 04/29/2023   12:03 PM 03/15/2023    9:19 AM  Hepatic Function  Total Protein 6.0 - 8.3 g/dL 7.5  7.7  7.5   Albumin 3.5 - 5.2 g/dL 4.8  4.0    AST 0 - 37 U/L 12  18  10    ALT 0 - 35 U/L 9  10  7    Alk Phosphatase 39 - 117 U/L 50  58  Total Bilirubin 0.2 - 1.2 mg/dL 0.7  1.0  0.4       Current Medications:     Current Outpatient Medications (Respiratory):    fluticasone  (FLONASE ) 50 MCG/ACT nasal spray, Place 1 spray into both nostrils 2 (two) times daily.  Current Outpatient Medications (Analgesics):    butalbital -acetaminophen -caffeine  (FIORICET) 50-325-40 MG tablet, Take 1-2 tablets by mouth every 6 (six) hours as needed for headache.   Current Outpatient Medications (Other):    ALPRAZolam  (XANAX ) 0.5 MG tablet, TAKE ONE TABLET BY MOUTH TWICE A DAY AS NEEDED FOR ANXIETY   linaclotide (LINZESS) 145 MCG CAPS capsule, Take 145 mcg by mouth daily before breakfast.   metroNIDAZOLE  (FLAGYL ) 500 MG tablet, Take 1 tablet (500 mg total) by mouth 3 (three) times daily for 10 days.   pregabalin  (LYRICA ) 75 MG capsule, TAKE ONE CAPSULE BY MOUTH TWICE A  DAY   sennosides-docusate sodium  (SENOKOT-S) 8.6-50 MG tablet, Take two tablets at night time while taking norco   hydrocortisone  (ANUSOL -HC) 25 MG suppository, Place 1 suppository (25 mg total) rectally 2 (two) times daily.   nifedipine  0.3 % ointment*, Place 1 Application rectally in the morning, at noon, and at bedtime.   trimethoprim -polymyxin b  (POLYTRIM ) ophthalmic solution, Place 1 drop into the left eye every 6 (six) hours. * These medications belong to multiple therapeutic classes and are listed under each applicable group.  Medical History:  Past Medical History:  Diagnosis Date   Anxiety    GERD (gastroesophageal reflux disease)    History of Clostridium difficile colitis    History of hysterectomy    Scoliosis    Allergies:  Allergies  Allergen Reactions   Amitriptyline     Buspirone  Hives     Surgical History:  She  has a past surgical history that includes Tubal ligation (2011); Upper gi endoscopy; Colonoscopy; Total laparoscopic hysterectomy with salpingectomy (N/A, 05/30/2019); Cystoscopy (N/A, 05/30/2019); and Colonoscopy (N/A, 01/01/2024). Family History:  Her family history includes Breast cancer (age of onset: 23) in her mother; Breast cancer (age of onset: 73) in her maternal grandmother and another family member; Colon cancer in her paternal grandfather; Colon cancer (age of onset: 29) in her cousin; Colon cancer (age of onset: 64) in her cousin; Colon cancer (age of onset: 107) in her father; Colon polyps (age of onset: 32) in her sister; Diabetes in her paternal grandmother; Healthy in her sister; Heart disease in her paternal grandmother; Hyperlipidemia in her maternal grandfather, mother, and paternal grandmother; Hypertension in her maternal grandfather, mother, and paternal grandmother; Rectal cancer in her father.  REVIEW OF SYSTEMS  : All other systems reviewed and negative except where noted in the History of Present Illness.  PHYSICAL EXAM: BP 126/80   Pulse  75   Ht 5' 6 (1.676 m)   Wt 133 lb (60.3 kg)   LMP 05/30/2019   BMI 21.47 kg/m  Physical Exam   GENERAL APPEARANCE: Well nourished, in no apparent distress. HEENT: No cervical lymphadenopathy, unremarkable thyroid , sclerae anicteric, conjunctiva pink. RESPIRATORY: Respiratory effort normal, breath sounds equal bilaterally without rales, rhonchi, or wheezing. CARDIO: Regular rate and rhythm with no murmurs, rubs, or gallops, peripheral pulses intact. ABDOMEN: Soft, non-distended, active bowel sounds in all four quadrants, no tenderness to palpation, no rebound, no mass appreciated. RECTAL: Posterior right buttock with slightly indurated ulcer with white cap. Definite right posterior fissure. Right posterior hemorrhoid. Presence of stool in rectum. MUSCULOSKELETAL: Full range of motion, normal gait, without edema. SKIN: Dry, intact without rashes  or lesions. No jaundice. NEURO: Alert, oriented, no focal deficits. PSYCH: Cooperative, normal mood and affect.     PROCEDURE Anoscopy: Posterior right buttock with slightly indurated ulcer with white cap. No extension into rectum. Right posterior hemorrhoid not significantly enlarged. Right posterior fissure present. Negative for blood. Alan JONELLE Coombs, PA-C 3:39 PM

## 2024-03-20 NOTE — Patient Instructions (Addendum)
 nifedipine applied twice daily for 6 weeks The ointment is typically applied twice daily using a pea-sized amount on a gloved, lubricated finger, massaged into the anal canal up to the first knuckle to ensure effective sphincter relaxation. _______________________________________________________  If your blood pressure at your visit was 140/90 or greater, please contact your primary care physician to follow up on this.  _______________________________________________________  If you are age 42 or older, your body mass index should be between 23-30. Your Body mass index is 21.47 kg/m. If this is out of the aforementioned range listed, please consider follow up with your Primary Care Provider.  If you are age 42 or younger, your body mass index should be between 19-25. Your Body mass index is 21.47 kg/m. If this is out of the aformentioned range listed, please consider follow up with your Primary Care Provider.   ________________________________________________________  The [facility] providers would like to encourage you to use MYCHART to communicate with providers for non-urgent requests or questions.  Due to long hold times on the telephone, sending your provider a message by [facility] may be a faster and more efficient way to get a response.  Please allow 48 business hours for a response.  Please remember that this is for non-urgent requests.  _______________________________________________________  [facility] is using a team-based approach to care.  Your team is made up of your doctor and two to three APPS. Our APPS (Nurse Practitioners and Physician Assistants) work with your physician to ensure care continuity for you. They are fully qualified to address your health concerns and develop a treatment plan. They communicate directly with your gastroenterologist to care for you. Seeing the Advanced Practice Practitioners on your physician's team can help you by facilitating care more  promptly, often allowing for earlier appointments, access to diagnostic testing, procedures, and other specialty referrals.   Please do the following: Purchase a bottle of Miralax  over the counter as well as a box of 5 mg dulcolax tablets. Take 4 dulcolax tablets. Wait 1 hour. You will then drink 6-8 capfuls of Miralax  mixed in an adequate amount of water /juice/gatorade (you may choose which of these liquids to drink) over the next 2-3 hours. You should expect results within 1 to 6 hours after completing the bowel purge. Go to the er if you have severe AB pain, can not pass gas or stool in over 12 hours, can not hold down any food.   Miralax  is an osmotic laxative.  It only brings more water  into the stool.  This is safe to take daily.  Can take up to 17 gram of miralax  twice a day.  Mix with juice or coffee.  Start 1 capful at night for 3-4 days and reassess your response in 3-4 days.  You can increase and decrease the dose based on your response.  Remember, it can take up to 3-4 days to take effect OR for the effects to wear off.   I often pair this with benefiber in the morning to help assure the stool is not too loose.   FIBER SUPPLEMENT You can do metamucil or fibercon once or twice a day but if this causes gas/bloating please switch to Benefiber or Citracel.  Fiber is good for constipation/diarrhea/irritable bowel syndrome.  It can also help with weight loss and can help lower your bad cholesterol (LDL).  Please do 1 TBSP in the morning in water , coffee, or tea.  It can take up to a month before you can see a  difference with your bowel movements.  It is cheapest from [employer], [employer], [employer].    Here some information about pelvic floor dysfunction. This may be contributing to some of your symptoms. We will continue with our evaluation but I do want you to consider adding on fiber supplement with low-dose MiraLAX  daily. We could also refer to pelvic floor physical  therapy.   Pelvic Floor Dysfunction, Female Pelvic floor dysfunction (PFD) is a condition that results when the group of muscles and connective tissues that support the organs in the pelvis (pelvic floor muscles) do not work well. These muscles and their connections form a sling that supports the colon and bladder. In women, they also support the uterus. PFD causes pelvic floor muscles to be too weak, too tight, or both. In PFD, muscle movements are not coordinated. This may cause bowel or bladder problems. It may also cause pain. What are the causes? This condition may be caused by an injury to the pelvic area or by a weakening of pelvic muscles. This often results from pregnancy and childbirth or other types of strain. In many cases, the exact cause is not known. What increases the risk? The following factors may make you more likely to develop this condition: Having chronic bladder tissue inflammation (interstitial cystitis). Being an older person. Being overweight. History of radiation treatment for cancer in the pelvic region. Previous pelvic surgery, such as removal of the uterus (hysterectomy). What are the signs or symptoms? Symptoms of this condition vary and may include: Bladder symptoms, such as: Trouble starting urination and emptying the bladder. Frequent urinary tract infections. Leaking urine when coughing, laughing, or exercising (stress incontinence). Having to pass urine urgently or frequently. Pain when passing urine. Bowel symptoms, such as: Constipation. Urgent or frequent bowel movements. Incomplete bowel movements. Painful bowel movements. Leaking stool or gas. Unexplained genital or rectal pain. Genital or rectal muscle spasms. Low back pain. Other symptoms may include: A heavy, full, or aching feeling in the vagina. A bulge that protrudes into the vagina. Pain during or after sex. How is this diagnosed? This condition may be diagnosed based on: Your  symptoms and medical history. A physical exam. During the exam, your health care provider may check your pelvic muscles for tightness, spasm, pain, or weakness. This may include a rectal exam and a pelvic exam. In some cases, you may have diagnostic tests, such as: Electrical muscle function tests. Urine flow testing. X-ray tests of bowel function. Ultrasound of the pelvic organs. How is this treated? Treatment for this condition depends on the symptoms. Treatment options include: Physical therapy. This may include Kegel exercises to help relax or strengthen the pelvic floor muscles. Biofeedback. This type of therapy provides feedback on how tight your pelvic floor muscles are so that you can learn to control them. Internal or external massage therapy. A treatment that involves electrical stimulation of the pelvic floor muscles to help control pain (transcutaneous electrical nerve stimulation, or TENS). Sound wave therapy (ultrasound) to reduce muscle spasms. Medicines, such as: Muscle relaxants. Bladder control medicines. Surgery to reconstruct or support pelvic floor muscles may be an option if other treatments do not help. Follow these instructions at home: Activity Do your usual activities as told by your health care provider. Ask your health care provider if you should modify any activities. Do pelvic floor strengthening or relaxing exercises at home as told by your physical therapist. Lifestyle Maintain a healthy weight. Eat foods that are high in fiber, such as beans,  whole grains, and fresh fruits and vegetables. Limit foods that are high in fat and processed sugars, such as fried or sweet foods. Manage stress with relaxation techniques such as yoga or meditation. General instructions If you have problems with leakage: Use absorbable pads or wear padded underwear. Wash frequently with mild soap. Keep your genital and anal area as clean and dry as possible. Ask your health care  provider if you should try a barrier cream to prevent skin irritation. Take warm baths to relieve pelvic muscle tension or spasms. Take over-the-counter and prescription medicines only as told by your health care provider. Keep all follow-up visits. How is this prevented? The cause of PFD is not always known, but there are a few things you can do to reduce the risk of developing this condition, including: Staying at a healthy weight. Getting regular exercise. Managing stress. Contact a health care provider if: Your symptoms are not improving with home care. You have signs or symptoms of PFD that get worse at home. You develop new signs or symptoms. You have signs of a urinary tract infection, such as: Fever. Chills. Increased urinary frequency. A burning feeling when urinating. You have not had a bowel movement in 3 days (constipation). Summary Pelvic floor dysfunction results when the muscles and connective tissues in your pelvic floor do not work well. These muscles and their connections form a sling that supports your colon and bladder. In women, they also support the uterus. PFD may be caused by an injury to the pelvic area or by a weakening of pelvic muscles. PFD causes pelvic floor muscles to be too weak, too tight, or a combination of both. Symptoms may vary from person to person. In most cases, PFD can be treated with physical therapies and medicines. Surgery may be an option if other treatments do not help. This information is not intended to replace advice given to you by your health care provider. Make sure you discuss any questions you have with your health care provider. Document Revised: 08/12/2020 Document Reviewed: 08/12/2020 Elsevier Patient Education  2022 [employer].

## 2024-03-26 NOTE — Telephone Encounter (Signed)
 Secure staff message sent to [facility] Referrals pool with request to contact patient to schedule evaluation. Patient notified of referral via MyChart.

## 2024-03-27 ENCOUNTER — Telehealth: Payer: Self-pay

## 2024-03-27 NOTE — Telephone Encounter (Signed)
-----   Message from Erminio MATSU sent at 03/27/2024  3:09 PM EST ----- Regarding: RE: [facility] Referral Appt. 12/23 @ 3:15 w/Dr. Sheldon.  Thanks.  Erminio ----- Message ----- From: Mercer Cristino SAILOR, RN Sent: 03/26/2024  12:50 PM EST To: [facility] Referrals Subject: [facility] Referral                                   Please contact patient to schedule evaluation of persistent anal fissure in setting of normal colonoscopy. Thanks

## 2024-04-05 ENCOUNTER — Encounter: Payer: Self-pay | Admitting: Family

## 2024-04-09 NOTE — Progress Notes (Signed)
 "  PATIENT NAME: Susan Wade MRN: [id number] DOB: 1981/03/20 PHYSICIANS:  REFERRING PHYSICIAN:  Corwin Sievert, NP  CARE TEAM:   Patient Care Team: Susan Sievert, NP as PCP - General Susan, Susan Jansky, MD as Referring Physician (Gastroenterology) Wade, Susan Bitter, MD as Consulting Provider (General Surgery) Susan Alan Dines, PA as Physician Assistant (Cardiovascular Disease)  CONSULTING PROVIDER:  ELSPETH Wade SHELDON, MD     SUBJECTIVE   Chief Complaint: Susan Wade    Susan Wade is a 43 y.o. female  who is seen today as an office consultation  at the request of NP. Susan Wade  for evaluation of Anal pain, probable fissure  History of Present Illness: Level of Interpreter Services: No interpreter needed (no language barrier)  43 year old female.  Has had intermittent episodes of rectal pressure and discomfort.  Chronic constipation.  Did not get relief with Amitiza .  Linzess helped but not able to continue due to cost.  Noted to have fissure on anoscopy by [facility] gastroenterology.  MiraLAX  bowel regimen recommended.  Nitroglycerin  cream offered.  They recommended pelvic or physical therapy and trial of Trulance.  Possible anal manometry.  Also has a history of hemorrhoids with prior banding x 1.  Given persistent symptoms surgical consultation offered.    Patient also with history of interstitial cystitis with pelvic pain on Lyrica .  No history of recurrent UTIs or urinary incontinence.  Some evidence of gastritis on prior endoscopies but no major upper abdominal symptoms.  Did have colonoscopy by Dr. Unk at [facility] 01/01/2024.  Colon proximally were normal.  External hemorrhoids noted.  Patient case comes with her husband.  She has been having worsening symptoms for the past 5 months.  She feels pressure all the time like she needs to go but cannot completely eliminate.  She is trying stool softeners at this point.  She gets some burning discomfort  to straining.  Never gets any bleeding.  Feels like it is hard to pass.  Otherwise rather healthy.  She does smoke 1/2 pack of cigarettes a day.  She can walk half hour without difficulty.  No diabetes or sleep apnea.  No cardiac or pulmonary issues.  She had a tubal ligation and hysterectomy.  No other surgeries.  She does not recall any family history of bowel problems.  No incontinence to flatus or stool.  No streaking or bleeding on her underwear or needing to wear pads.   Medical History:  Past Medical History:  Diagnosis Date   Anxiety    Scoliosis     Patient Active Problem List  Diagnosis   Rectal pressure   Difficulty defecating   Prolapsed internal hemorrhoids, grade 2   Anal sphincter spasm    Past Surgical History:  Procedure Laterality Date   LAPAROSCOPIC TUBAL LIGATION  2011   COLONOSCOPY  05/16/2019   Normal Colon/No Repeat needed at this time/Susan   EGD  05/16/2019   Gastritis/No Repeat/Susan   COLON@[email or web address]   01/01/2024   Normal/Repeat78yrs/[facility]   HYSTERECTOMY       Allergies  Allergen Reactions   Amitriptyline  Other (See Comments)   Buspirone  Hives    Current Outpatient Medications on File Prior to Visit  Medication Sig Dispense Refill   ALPRAZolam  (XANAX ) 0.5 MG tablet Take 0.5 mg by mouth 2 (two) times daily as needed for Anxiety     docusate (COLACE) 100 MG capsule Take 100 mg by mouth 2 (two) times daily     pregabalin  (LYRICA ) 75 MG capsule Take 75  mg by mouth 2 (two) times daily     acetaminophen  (TYLENOL ) 500 MG tablet Take 1,000 mg by mouth every 8 (eight) hours as needed for Pain     famotidine  (PEPCID ) 40 MG tablet Take 1 tablet (40 mg total) by mouth nightly 90 tablet 1   ibuprofen  (MOTRIN ) 200 MG tablet Take 200 mg by mouth once daily as needed for Pain     linaCLOtide (LINZESS) 145 mcg capsule Take 1 capsule (145 mcg total) by mouth once daily 30 capsule 0   lubiprostone  (AMITIZA ) 24 MCG capsule Take 1 capsule (24 mcg  total) by mouth 2 (two) times daily for 30 days 60 capsule 0   sodium, potassium, and magnesium (SUPREP) oral solution Take 1 Bottle by mouth as directed One kit contains 2 bottles.  Take both bottles at the times instructed by your provider. 354 mL 0   traMADoL (ULTRAM) 50 mg tablet Take 1 tablet (50 mg total) by mouth every 6 (six) hours as needed for Pain for up to 5 doses 5 tablet 0   No current facility-administered medications on file prior to visit.    Family History  Problem Relation Age of Onset   Breast cancer Mother    Colon cancer Father 50   Celiac disease Paternal Grandfather      Social History   Tobacco Use  Smoking Status Light Smoker  Smokeless Tobacco Never     Social History   Socioeconomic History   Marital status: Married   Number of children: 2  Tobacco Use   Smoking status: Light Smoker   Smokeless tobacco: Never  Vaping Use   Vaping status: Never Used  Substance and Sexual Activity   Alcohol use: Yes   Drug use: Never   Sexual activity: Yes    Partners: Male    Birth control/protection: None   Social Drivers of Health   Financial Resource Strain: Low Risk (03/21/2019)   Received [facility]   Overall Financial Resource Strain (CARDIA)    Difficulty of Paying Living Expenses: Not hard at all  Housing Stability: Unknown (01/03/2024)   Housing Stability Vital Sign    Homeless in the Last Year: No    ############################################################  Review of Systems: A complete review of systems (ROS) was obtained from the patient.   We have reviewed this information and discussed as appropriate with the patient.   See HPI as well for other pertinent ROS.  Constitutional:  No fevers, chills, sweats.  Weight stable Eyes:  No vision changes, No discharge HENT:  No sore throats, nasal drainage Lymph: No neck swelling, No bruising easily Pulmonary:  No cough, productive sputum CV: No orthopnea, PND . No  exertional chest/neck/shoulder/arm pain.  Patient can walk 2-3 miles without difficulty.    GI:  No personal nor family history of GI/colon cancer, inflammatory bowel disease, irritable bowel syndrome, allergy such as Celiac Sprue, dietary/dairy problems, colitis, ulcers nor gastritis.  No recent sick contacts/gastroenteritis.  No travel outside the country.  No changes in diet.  Renal: No UTIs, No hematuria Genital:  No drainage, bleeding, masses Musculoskeletal: No severe joint pain.   Good ROM major joints Skin:  No sores or lesions Heme/Lymph:  No easy bleeding.  No swollen lymph nodes Neuro:  No active seizures.  No facial droop Psych:  No hallucinations.  No agitation  OBJECTIVE   Vitals:   04/09/24 1439  BP: 128/68  Pulse: 110  Weight: 59 kg (130 lb)  Height: 167.6 cm (  5' 6)    Body mass index is 20.98 kg/m.  PHYSICAL EXAM:   Constitutional: Not cachectic.  Hygeine adequate.  Vitals signs as above.   Eyes: No glasses.  Vision adequate,Pupils reactive, normal extraocular movements. Sclera nonicteric Neuro: CN II-XII intact.  No major focal sensory defects.  No major motor deficits. Lymph: No head/neck/groin lymphadenopathy Psych:  No severe agitation.  No severe anxiety.  Judgment & insight Adequate, Oriented x4, HENT: Normocephalic, Mucus membranes moist.  No thrush.  Hearing: adequate Neck: Supple, No tracheal deviation.  No obvious thyromegaly Chest: No pain to chest wall compression.  Good respiratory excursion.  No audible wheezing CV:  Pulses intact.  regular.  No major extremity edema Ext: No obvious deformity or contracture.  Edema: Not present.  No cyanosis Skin: No major subcutaneous nodules.  Warm and dry Musculoskeletal: Severe joint rigidity not present.  No obvious clubbing.  No digital petechiae.  Mobility: no assist device moving easily without restrictions  Abdomen:  Flat Soft.   Nondistended.  Nontender.    Hernia: Not present. Diastasis recti: Not  present.  No hepatomegaly.  No splenomegaly.  Genital/Pelvic:  Inguinal hernia: Not present.  Inguinal lymph nodes: without lymphadenopathy nor hidradenitis.    Rectal:   PE Chaperone note: Russell Christine, CMA, was included in the room as chaperone for sensitive portions of the exam   Perianal skin Clean with good hygiene  Pruritis ani: Not present Pilonidal disease: Not present  External hemorrhoids: Not present Condyloma / warts: Not present Anal fissure: Not present.  No evidence of any prior scarring that would argue towards a prior fissure.  No sentinel tag. Perirectal abscess/fistula: Not present Sphincter tone: Mildly increased with anal spasming  Digital and anoscopic rectal exam: Tolerated   Hemorrhoidal piles: Grade 1-2 internal hemorrhoids.  No obvious bleeding or flaring.  Worst right anterior.  No prolapsing outward Prostate:  N/A Rectovaginal septum: Intact with normal thickness & no rectocele Rectal masses: Not present  Other significant findings:  N/A No coccygeal pain or click/stepoff.  Nor chandelier sign.     ###################################  ###################################################################  Labs, Imaging and Diagnostic Testing:  Located in 'Care Everywhere' section of Epic EMR chart   PRIOR [facility] CLINIC NOTES:  Not applicable   SURGERY NOTES:  Not applicable   PATHOLOGY:  Not applicable    Assessment and Plan:  DIAGNOSES:  Diagnoses and all orders for this visit:  Difficulty defecating -     Cancel: MRI pelvis without contrast; Future -     Ambulatory Referral to Physical Therapy -     MRI pelvis without contrast; Future  Rectal pressure -     Cancel: MRI pelvis without contrast; Future -     Ambulatory Referral to Physical Therapy -     MRI pelvis without contrast; Future  Prolapsed internal hemorrhoids, grade 2 -     Cancel: MRI pelvis without contrast; Future -     Ambulatory Referral to Physical  Therapy -     MRI pelvis without contrast; Future  Anal sphincter spasm -     Cancel: MRI pelvis without contrast; Future -     Ambulatory Referral to Physical Therapy -     MRI pelvis without contrast; Future     ASSESSMENT/PLAN  Pleasant woman struggling with severe constipation a lot of rectal pressure and discomfort.  I see no evidence of any anal fissure.  Her hemorrhoids do not seem particularly inflamed and I see no external disease.  Things  have calm down since the colonoscopy.  I see nothing acutely surgical that needs management.  I strongly suspect her anorectal complaints are due to her difficulty defecating.  I agree with proceeding with that workup:  MRI Defacography to see if there is any physiological dysfunction or other concerns such as enterocele or rectocele or cystocele  Anal manometry since she has a lot of anal spasming to see if there is any hyperfunctioning or failure in relaxation or evacuation  Pelvic floor physical therapy  She needs to correct her constipation.  She was trying to go back on Colace only.  That is not working.  I recommend she switch back to MiraLAX .  My instinct if she needs it twice a day and probably needs to increase to double or triple dosed twice a day until she is moving her bowels every day.    I strongly suspect she is going to need some type of promotility agent in the Amitiza /Linzess family.  GI is considering starting Trulance.  Problem is insurance will not cover these very well.  I think until she can get on that regularly, she will struggle with a lot of pain discomfort and eventually will present with anal fissures and hemorrhoid problems that will require operations.  I asked her to call us  back once she gets the Defacography manometry done.  I would start pelvic floor therapy physical therapy soon as possible.  Should she do those interventions with correction and constipation and still struggle; then,  could consider  second opinion with my colorectal partner specialty and anal sphincter dysfunction, Dr. Debby.    FOLLOWUP:  Return for Call with update on your progess after studies & pelvic floor therapy.  I have reviewed this patient's available data, including medical history, doctor notes, radiology & other studies, events of note, test results, physical exam, etc as part of my evaluation.   A significant portion of that time was spent in counseling in discussion of management, need for further testing, need for other medical or surgical input, etc.   Care was provided by me.  Based on my assessment, this care required MODERATE- Level 4 level of medical decision making.  04/09/2024    The plan was discussed in detail with the patient today, who expressed understanding & appreciation.  The patient has my contact information, and understands to call me with any additional questions or concerns.  I & my group would be happy to see the patient back sooner if the need arises.  Of note, portions of this report may have been transcribed using voice recognition software. Effort was made to ensure accuracy; however, inadvertent computerized transcription errors with sound-a-like substitutions may be present.   Any transcriptional errors that result from this process are unintentional.  ########################################################   Susan KYM Schultze, MD, FACS, MASCRS Esophageal, Gastrointestinal & Colorectal Surgery Robotic and Minimally Invasive Surgery  [facility]  [address], [address] [place], [place] [address] [phone] Fax [phone] Main              04/09/2024    "

## 2024-04-12 ENCOUNTER — Other Ambulatory Visit: Payer: Self-pay | Admitting: Surgery

## 2024-04-12 DIAGNOSIS — R198 Other specified symptoms and signs involving the digestive system and abdomen: Secondary | ICD-10-CM

## 2024-04-12 DIAGNOSIS — K641 Second degree hemorrhoids: Secondary | ICD-10-CM

## 2024-04-12 DIAGNOSIS — K5900 Constipation, unspecified: Secondary | ICD-10-CM

## 2024-04-12 DIAGNOSIS — K594 Anal spasm: Secondary | ICD-10-CM

## 2024-04-20 ENCOUNTER — Ambulatory Visit
Admission: RE | Admit: 2024-04-20 | Discharge: 2024-04-20 | Disposition: A | Payer: Self-pay | Source: Ambulatory Visit | Attending: Surgery | Admitting: Surgery

## 2024-04-20 DIAGNOSIS — R198 Other specified symptoms and signs involving the digestive system and abdomen: Secondary | ICD-10-CM

## 2024-04-20 DIAGNOSIS — K5900 Constipation, unspecified: Secondary | ICD-10-CM

## 2024-04-20 DIAGNOSIS — K641 Second degree hemorrhoids: Secondary | ICD-10-CM

## 2024-04-20 DIAGNOSIS — K594 Anal spasm: Secondary | ICD-10-CM

## 2024-05-21 ENCOUNTER — Other Ambulatory Visit: Payer: Self-pay | Admitting: Family

## 2024-05-21 ENCOUNTER — Encounter: Payer: Self-pay | Admitting: Family

## 2024-05-21 DIAGNOSIS — N3010 Interstitial cystitis (chronic) without hematuria: Secondary | ICD-10-CM

## 2024-06-24 ENCOUNTER — Ambulatory Visit: Payer: Self-pay | Admitting: Physical Therapy

## 2024-07-08 ENCOUNTER — Ambulatory Visit: Payer: Self-pay | Admitting: Obstetrics and Gynecology

## 2024-07-24 ENCOUNTER — Ambulatory Visit (HOSPITAL_COMMUNITY): Admit: 2024-07-24 | Payer: Self-pay | Admitting: General Surgery

## 2024-07-24 ENCOUNTER — Encounter (HOSPITAL_COMMUNITY): Payer: Self-pay

## 2024-07-24 SURGERY — MANOMETRY, ANORECTAL
